# Patient Record
Sex: Male | Born: 1970 | Race: White | Hispanic: No | Smoking: Never smoker
Health system: Southern US, Community
[De-identification: ages and names within clinical notes are randomized; demographics above are authoritative.]

## PROBLEM LIST (undated history)

## (undated) DIAGNOSIS — K429 Umbilical hernia without obstruction or gangrene: Secondary | ICD-10-CM

## (undated) DIAGNOSIS — J189 Pneumonia, unspecified organism: Secondary | ICD-10-CM

## (undated) DIAGNOSIS — I10 Essential (primary) hypertension: Secondary | ICD-10-CM

## (undated) DIAGNOSIS — M1990 Unspecified osteoarthritis, unspecified site: Secondary | ICD-10-CM

## (undated) DIAGNOSIS — N529 Male erectile dysfunction, unspecified: Secondary | ICD-10-CM

## (undated) DIAGNOSIS — K4090 Unilateral inguinal hernia, without obstruction or gangrene, not specified as recurrent: Secondary | ICD-10-CM

## (undated) DIAGNOSIS — F319 Bipolar disorder, unspecified: Secondary | ICD-10-CM

## (undated) HISTORY — PX: SHOULDER SURGERY: SHX246

## (undated) HISTORY — DX: Essential (primary) hypertension: I10

## (undated) HISTORY — PX: ANKLE SURGERY: SHX546

## (undated) HISTORY — DX: Bipolar disorder, unspecified: F31.9

## (undated) HISTORY — PX: OTHER SURGICAL HISTORY: SHX169

## (undated) HISTORY — PX: GASTRIC BYPASS: SHX52

## (undated) HISTORY — PX: BACK SURGERY: SHX140

---
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
## 2004-05-24 HISTORY — PX: INGUINAL HERNIA REPAIR: SHX194

## 2018-06-26 DIAGNOSIS — F32A Depression, unspecified: Secondary | ICD-10-CM | POA: Insufficient documentation

## 2018-06-26 DIAGNOSIS — G4730 Sleep apnea, unspecified: Secondary | ICD-10-CM | POA: Insufficient documentation

## 2018-06-26 DIAGNOSIS — K589 Irritable bowel syndrome without diarrhea: Secondary | ICD-10-CM | POA: Insufficient documentation

## 2018-06-26 DIAGNOSIS — K219 Gastro-esophageal reflux disease without esophagitis: Secondary | ICD-10-CM | POA: Insufficient documentation

## 2018-06-26 DIAGNOSIS — M549 Dorsalgia, unspecified: Secondary | ICD-10-CM | POA: Insufficient documentation

## 2018-09-28 DIAGNOSIS — F3175 Bipolar disorder, in partial remission, most recent episode depressed: Secondary | ICD-10-CM | POA: Insufficient documentation

## 2019-01-30 DIAGNOSIS — K913 Postprocedural intestinal obstruction, unspecified as to partial versus complete: Secondary | ICD-10-CM | POA: Insufficient documentation

## 2020-12-10 ENCOUNTER — Other Ambulatory Visit: Payer: Self-pay

## 2020-12-10 ENCOUNTER — Ambulatory Visit
Admission: RE | Admit: 2020-12-10 | Discharge: 2020-12-10 | Disposition: A | Discharge status: Home / Self Care | Payer: BC Managed Care – PPO | Source: Ambulatory Visit

## 2020-12-10 VITALS — BP 146/92 | HR 76 | Temp 98.4°F | Resp 18

## 2020-12-10 DIAGNOSIS — M7989 Other specified soft tissue disorders: Secondary | ICD-10-CM | POA: Diagnosis not present

## 2020-12-10 MED ORDER — PREDNISONE 10 MG PO TABS: ORAL_TABLET | ORAL | 0 refills | Status: DC

## 2020-12-10 NOTE — ED Triage Notes (Signed)
Pt Is present today with right pointer injury. Pt states that he may have a cyst on the inner part on his pointer finger. Pt states that he noticed it two weeks ago.

## 2020-12-10 NOTE — ED Provider Notes (Signed)
[facility]    CSN: [id number] Arrival date & time: 12/10/20  1202      History   Chief Complaint Chief Complaint  Patient presents with   Cyst    Inner right pointer finger      HPI Aaron Lozano is a 50 y.o. male presenting today for evaluation of right index finger pain.  Reports over the past couple weeks he has developed an area of swelling to the lateral aspect of his index finger.  Has progressively worsened in pain.  Using diclofenac without relief.  Denies injury or trauma.  Denies history of similar.  Pain interfering with bending as well as day-to-day activities  HPI  History reviewed. No pertinent past medical history.  There are no problems to display for this patient.   Past Surgical History:  Procedure Laterality Date   ANKLE SURGERY     BACK SURGERY     GASTRIC BYPASS     SHOULDER SURGERY         Home Medications    Prior to Admission medications   Medication Sig Start Date End Date Taking? Authorizing Provider  predniSONE (DELTASONE) 10 MG tablet Begin with 6 tabs on day 1&2, 5 tab on day 3&4, 4 tab on day 5&6, 3 tab on day 7&8, 2 tab on day 9&10, 1 tab on day 11&12-take with food 12/10/20  Yes Adisynn Suleiman C, PA-C  sildenafil (REVATIO) 20 MG tablet SMARTSIG:5 Tablet(s) By Mouth Daily PRN 11/30/20   [provider]    Family History History reviewed. No pertinent family history.  Social History Social History   Tobacco Use   Smoking status: Never   Smokeless tobacco: Never  Vaping Use   Vaping Use: Never used  Substance Use Topics   Alcohol use: Yes   Drug use: Never     Allergies   Patient has no known allergies.   Review of Systems Review of Systems  Constitutional:  Negative for fatigue and fever.  Eyes:  Negative for redness, itching and visual disturbance.  Respiratory:  Negative for shortness of breath.   Cardiovascular:  Negative for chest pain and leg swelling.  Gastrointestinal:  Negative for nausea  and vomiting.  Musculoskeletal:  Positive for arthralgias. Negative for myalgias.  Skin:  Negative for color change, rash and wound.  Neurological:  Negative for dizziness, syncope, weakness, light-headedness and headaches.    Physical Exam Triage Vital Signs ED Triage Vitals  Enc Vitals Group     BP      Pulse      Resp      Temp      Temp src      SpO2      Weight      Height      Head Circumference      Peak Flow      Pain Score      Pain Loc      Pain Edu?      Excl. in [place]?    No data found.  Updated Vital Signs BP (!) 146/92   Pulse 76   Temp 98.4 F (36.9 C)   Resp 18   SpO2 95%   Visual Acuity Right Eye Distance:   Left Eye Distance:   Bilateral Distance:    Right Eye Near:   Left Eye Near:    Bilateral Near:     Physical Exam Vitals and nursing note reviewed.  Constitutional:      Appearance: He is  well-developed.     Comments: No acute distress  HENT:     Head: Normocephalic and atraumatic.     Nose: Nose normal.  Eyes:     Conjunctiva/sclera: Conjunctivae normal.  Cardiovascular:     Rate and Rhythm: Normal rate.  Pulmonary:     Effort: Pulmonary effort is normal. No respiratory distress.  Abdominal:     General: There is no distension.  Musculoskeletal:        General: Normal range of motion.     Cervical back: Neck supple.     Comments: Right index finger with firm palpable knot over lateral aspect of proximal phalanx without overlying erythema or warmth, full active range of motion at IP joints and MCP joint although does elicit pain, but area is sensitive to light touch  Skin:    General: Skin is warm and dry.  Neurological:     Mental Status: He is alert and oriented to person, place, and time.     UC Treatments / Results  Labs (all labs ordered are listed, but only abnormal results are displayed) Labs Reviewed - No data to display  EKG   Radiology No results found.  Procedures Procedures (including critical care  time)  Medications Ordered in UC Medications - No data to display  Initial Impression / Assessment and Plan / UC Course  I have reviewed the triage vital signs and the nursing notes.  Pertinent labs & imaging results that were available during my care of the patient were reviewed by me and considered in my medical decision making (see chart for details).     Patient with firm nodule versus cyst over lower aspect of index finger, recommending further follow-up with hand, trial of prednisone course, taper provided, warm compresses.  Discussed strict return precautions. Patient verbalized understanding and is agreeable with plan.  Final Clinical Impressions(s) / UC Diagnoses   Final diagnoses:  Swelling of right index finger     Discharge Instructions      Begin prednisone taper x12 days Warm compresses Follow-up with hand-contact below     ED Prescriptions     Medication Sig Dispense Auth. Provider   predniSONE (DELTASONE) 10 MG tablet Begin with 6 tabs on day 1&2, 5 tab on day 3&4, 4 tab on day 5&6, 3 tab on day 7&8, 2 tab on day 9&10, 1 tab on day 11&12-take with food 42 tablet Verna Hamon C, PA-C      PDMP not reviewed this encounter.   Janith Lima, PA-C 12/10/20 1250

## 2020-12-10 NOTE — Discharge Instructions (Addendum)
Begin prednisone taper x12 days Warm compresses Follow-up with hand-contact below

## 2021-02-03 ENCOUNTER — Emergency Department (HOSPITAL_BASED_OUTPATIENT_CLINIC_OR_DEPARTMENT_OTHER): Payer: BC Managed Care – PPO

## 2021-02-03 ENCOUNTER — Ambulatory Visit
Admission: EM | Admit: 2021-02-03 | Discharge: 2021-02-03 | Disposition: A | Discharge status: Home / Self Care | Payer: BC Managed Care – PPO

## 2021-02-03 ENCOUNTER — Emergency Department (HOSPITAL_BASED_OUTPATIENT_CLINIC_OR_DEPARTMENT_OTHER)
Admission: EM | Admit: 2021-02-03 | Discharge: 2021-02-03 | Disposition: A | Discharge status: Home / Self Care | Payer: BC Managed Care – PPO | Attending: Emergency Medicine | Admitting: Emergency Medicine

## 2021-02-03 ENCOUNTER — Other Ambulatory Visit: Payer: Self-pay

## 2021-02-03 ENCOUNTER — Encounter: Payer: Self-pay | Admitting: Emergency Medicine

## 2021-02-03 ENCOUNTER — Encounter (HOSPITAL_BASED_OUTPATIENT_CLINIC_OR_DEPARTMENT_OTHER): Payer: Self-pay

## 2021-02-03 DIAGNOSIS — K4090 Unilateral inguinal hernia, without obstruction or gangrene, not specified as recurrent: Secondary | ICD-10-CM

## 2021-02-03 DIAGNOSIS — R1031 Right lower quadrant pain: Secondary | ICD-10-CM | POA: Diagnosis not present

## 2021-02-03 LAB — CBC WITH DIFFERENTIAL/PLATELET
Abs Immature Granulocytes: 0.01 10*3/uL (ref 0.00–0.07)
Basophils Absolute: 0.1 10*3/uL (ref 0.0–0.1)
Basophils Relative: 1 %
Eosinophils Absolute: 0.1 10*3/uL (ref 0.0–0.5)
Eosinophils Relative: 2 %
HCT: 46.6 % (ref 39.0–52.0)
Hemoglobin: 15.7 g/dL (ref 13.0–17.0)
Immature Granulocytes: 0 %
Lymphocytes Relative: 26 %
Lymphs Abs: 1.4 10*3/uL (ref 0.7–4.0)
MCH: 30 pg (ref 26.0–34.0)
MCHC: 33.7 g/dL (ref 30.0–36.0)
MCV: 88.9 fL (ref 80.0–100.0)
Monocytes Absolute: 0.6 10*3/uL (ref 0.1–1.0)
Monocytes Relative: 11 %
Neutro Abs: 3.2 10*3/uL (ref 1.7–7.7)
Neutrophils Relative %: 60 %
Platelets: 228 10*3/uL (ref 150–400)
RBC: 5.24 MIL/uL (ref 4.22–5.81)
RDW: 13.1 % (ref 11.5–15.5)
WBC: 5.4 10*3/uL (ref 4.0–10.5)
nRBC: 0 % (ref 0.0–0.2)

## 2021-02-03 LAB — COMPREHENSIVE METABOLIC PANEL
ALT: 20 U/L (ref 0–44)
AST: 20 U/L (ref 15–41)
Albumin: 4.2 g/dL (ref 3.5–5.0)
Alkaline Phosphatase: 48 U/L (ref 38–126)
Anion gap: 7 (ref 5–15)
BUN: 14 mg/dL (ref 6–20)
CO2: 24 mmol/L (ref 22–32)
Calcium: 8.9 mg/dL (ref 8.9–10.3)
Chloride: 109 mmol/L (ref 98–111)
Creatinine, Ser: 0.92 mg/dL (ref 0.61–1.24)
GFR, Estimated: >60 mL/min (ref >60)
Glucose, Bld: 68 mg/dL — ABNORMAL LOW (ref 70–99)
Potassium: 4.1 mmol/L (ref 3.5–5.1)
Sodium: 140 mmol/L (ref 135–145)
Total Bilirubin: 0.9 mg/dL (ref 0.3–1.2)
Total Protein: 6.2 g/dL — ABNORMAL LOW (ref 6.5–8.1)

## 2021-02-03 LAB — URINALYSIS, ROUTINE W REFLEX MICROSCOPIC
Bilirubin Urine: NEGATIVE
Glucose, UA: NEGATIVE mg/dL
Hgb urine dipstick: NEGATIVE
Leukocytes,Ua: NEGATIVE
Nitrite: NEGATIVE
Specific Gravity, Urine: 1.031 — ABNORMAL HIGH (ref 1.005–1.030)
pH: 5.5 (ref 5.0–8.0)

## 2021-02-03 MED ORDER — IOHEXOL 350 MG/ML SOLN
80.0000 mL | Freq: Once | INTRAVENOUS | Status: AC | PRN
  Administered 2021-02-03: 80 mL via INTRAVENOUS

## 2021-02-03 MED ORDER — METHOCARBAMOL 500 MG PO TABS
1000.0000 mg | ORAL_TABLET | Freq: Once | ORAL | Status: AC
  Administered 2021-02-03: 1000 mg via ORAL
  Filled 2021-02-03: qty 2

## 2021-02-03 MED ORDER — HYDROMORPHONE HCL 1 MG/ML IJ SOLN
1.0000 mg | Freq: Once | INTRAMUSCULAR | Status: AC
  Administered 2021-02-03: 1 mg via INTRAVENOUS
  Filled 2021-02-03: qty 1

## 2021-02-03 MED ORDER — IOHEXOL 350 MG/ML SOLN: 80.0000 mL | Freq: Once | INTRAVENOUS | Status: DC | PRN

## 2021-02-03 MED ORDER — MORPHINE SULFATE (PF) 4 MG/ML IV SOLN
4.0000 mg | Freq: Once | INTRAVENOUS | Status: AC
Start: 2021-02-03 — End: 2021-02-03
  Administered 2021-02-03: 4 mg via INTRAVENOUS
  Filled 2021-02-03: qty 1

## 2021-02-03 MED ORDER — DICYCLOMINE HCL 10 MG PO CAPS
10.0000 mg | ORAL_CAPSULE | Freq: Once | ORAL | Status: AC
  Administered 2021-02-03: 10 mg via ORAL
  Filled 2021-02-03: qty 1

## 2021-02-03 MED ORDER — KETOROLAC TROMETHAMINE 15 MG/ML IJ SOLN
15.0000 mg | Freq: Once | INTRAMUSCULAR | Status: AC
  Administered 2021-02-03: 15 mg via INTRAVENOUS
  Filled 2021-02-03: qty 1

## 2021-02-03 MED ORDER — HYDROCODONE-ACETAMINOPHEN 5-325 MG PO TABS: 1.0000 | ORAL_TABLET | ORAL | 0 refills | Status: DC | PRN

## 2021-02-03 MED ORDER — SUCRALFATE 1 G PO TABS: 1.0000 g | ORAL_TABLET | Freq: Three times a day (TID) | ORAL | 0 refills | Status: DC

## 2021-02-03 MED ORDER — DICYCLOMINE HCL 20 MG PO TABS: 20.0000 mg | ORAL_TABLET | Freq: Two times a day (BID) | ORAL | 0 refills | Status: DC | PRN

## 2021-02-03 MED ORDER — ONDANSETRON HCL 4 MG/2ML IJ SOLN
4.0000 mg | Freq: Once | INTRAMUSCULAR | Status: AC
  Administered 2021-02-03: 4 mg via INTRAVENOUS
  Filled 2021-02-03: qty 2

## 2021-02-03 MED ORDER — SODIUM CHLORIDE 0.9 % IV BOLUS
1000.0000 mL | Freq: Once | INTRAVENOUS | Status: AC
  Administered 2021-02-03: 1000 mL via INTRAVENOUS

## 2021-02-03 NOTE — ED Triage Notes (Signed)
Pt is present today with RLQ. Pt states that he noticed the pain several days ago. Pt states that when he presses on that side he noticed a bulge. Pt describes the pain as being sharp.

## 2021-02-03 NOTE — ED Provider Notes (Signed)
[facility] DEPT Provider Note   CSN: [id number] Arrival date & time: 02/03/21  1353     History Chief Complaint  Patient presents with   Abdominal Pain    Aaron Lozano is a 50 y.o. male.  Patient is a 50 year old male with prior history of gastric bypass and left-sided hernia repair who is presenting today with worsening abdominal pain over the last 2 weeks.  Initially started 2 weeks ago as a dull pain in the right lower quadrant that over the last 3 to 4 days has become severe.  Any type of movement seems to make it worse.  Eating does not make it worse.  It will cause nausea at times.  However he denies any dysuria, frequency or urgency.  No diarrhea, vomiting, fever.  No cough or congestion.  He has no pain that radiates into his testicle.  He thinks he is seeing a bulge in that area but no prior history of hernia on the right.  The history is provided by the patient.  Abdominal Pain Pain location:  RLQ Pain quality: sharp, shooting and throbbing   Pain radiates to:  Does not radiate     History reviewed. No pertinent past medical history.  There are no problems to display for this patient.   Past Surgical History:  Procedure Laterality Date   ANKLE SURGERY     BACK SURGERY     GASTRIC BYPASS     INGUINAL HERNIA REPAIR Left 1988   INGUINAL HERNIA REPAIR  2006   SHOULDER SURGERY         History reviewed. No pertinent family history.  Social History   Tobacco Use   Smoking status: Never   Smokeless tobacco: Never  Vaping Use   Vaping Use: Every day  Substance Use Topics   Alcohol use: Yes   Drug use: Never    Home Medications Prior to Admission medications   Medication Sig Start Date End Date Taking? Authorizing Provider  predniSONE (DELTASONE) 10 MG tablet Begin with 6 tabs on day 1&2, 5 tab on day 3&4, 4 tab on day 5&6, 3 tab on day 7&8, 2 tab on day 9&10, 1 tab on day 11&12-take with food Patient not taking: Reported on  02/03/2021 12/10/20   Wieters, Madelynn Done C, PA-C  sildenafil (REVATIO) 20 MG tablet SMARTSIG:5 Tablet(s) By Mouth Daily PRN Patient not taking: Reported on 02/03/2021 11/30/20   [provider]    Allergies    Patient has no known allergies.  Review of Systems   Review of Systems  Gastrointestinal:  Positive for abdominal pain.  All other systems reviewed and are negative.  Physical Exam Updated Vital Signs BP (!) 139/91 (BP Location: Right Arm)   Pulse 79   Temp 98 F (36.7 C) (Oral)   Resp 16   Ht '5\' 9"'$  (1.753 m)   Wt 95.3 kg   SpO2 99%   BMI 31.01 kg/m   Physical Exam Vitals and nursing note reviewed.  Constitutional:      General: He is not in acute distress.    Appearance: Normal appearance. He is well-developed and normal weight.  HENT:     Head: Normocephalic and atraumatic.  Eyes:     Conjunctiva/sclera: Conjunctivae normal.     Pupils: Pupils are equal, round, and reactive to light.  Cardiovascular:     Rate and Rhythm: Normal rate and regular rhythm.     Heart sounds: No murmur heard. Pulmonary:     Effort:  Pulmonary effort is normal. No respiratory distress.     Breath sounds: Normal breath sounds. No wheezing or rales.  Abdominal:     General: There is no distension.     Palpations: Abdomen is soft.     Tenderness: There is abdominal tenderness in the right lower quadrant. There is guarding. There is no rebound.     Hernia: No hernia is present. There is no hernia in the right femoral area or right inguinal area.  Genitourinary:    Testes: Normal.  Musculoskeletal:        General: No tenderness. Normal range of motion.     Cervical back: Normal range of motion and neck supple.  Skin:    General: Skin is warm and dry.     Findings: No erythema or rash.  Neurological:     Mental Status: He is alert and oriented to person, place, and time. Mental status is at baseline.  Psychiatric:        Mood and Affect: Mood normal.        Behavior: Behavior  normal.    ED Results / Procedures / Treatments   Labs (all labs ordered are listed, but only abnormal results are displayed) Labs Reviewed  CBC WITH DIFFERENTIAL/PLATELET  COMPREHENSIVE METABOLIC PANEL  URINALYSIS, ROUTINE W REFLEX MICROSCOPIC    EKG None  Radiology No results found.  Procedures Procedures   Medications Ordered in ED Medications  HYDROmorphone (DILAUDID) injection 1 mg (has no administration in time range)  ondansetron (ZOFRAN) injection 4 mg (has no administration in time range)    ED Course  I have reviewed the triage vital signs and the nursing notes.  Pertinent labs & imaging results that were available during my care of the patient were reviewed by me and considered in my medical decision making (see chart for details).    MDM Rules/Calculators/A&P                           50 year old male presenting today for worsening right-sided abdominal pain.  He has significant pain with guarding in the right lower quadrant.  Symptoms have been present for approximately 2 weeks but are worsening in severity.  Concern for possible diverticulitis, lower suspicion for appendicitis just given the length of time his symptoms have been present.  Denies any urinary symptoms.  No radiation of pain into the testicle or the back.  Low suspicion suspicion for torsion, UTI or pyelonephritis.  Patient given pain control.  Labs and imaging are pending.  Final Clinical Impression(s) / ED Diagnoses Final diagnoses:  None    Rx / DC Orders ED Discharge Orders     None        Blanchie Dessert, MD 02/07/21 [id number]

## 2021-02-03 NOTE — ED Triage Notes (Signed)
Pt arrives POV, with c/o of RLQ abdominal pain for approximately 2 weeks, but has gotten worse over the last day.  Has had some nausea but denies vomiting or diarrhea.  Reports a bulge in RLQ.

## 2021-02-03 NOTE — ED Provider Notes (Signed)
[facility]    CSN: [id number] Arrival date & time: 02/03/21  1037      History   Chief Complaint Chief Complaint  Patient presents with   Abdominal Pain    HPI Aaron Lozano is a 50 y.o. male.  Patient reports right lower quadrant/inguinal pain intermittently for the last 2 weeks and he feels a bulge on his abdomen associated with the pain.  Increasing in pain severity and frequency.  Patient feels a bulge in his right lower quadrant near where his trunk meets his leg that can be extremely painful.  Pt has had inguinal hernias in the past but they were not painful, found on exam only. This morning pain was so severe it caused him to vomit and while the pain is less severe now, he still has pain.     Abdominal Pain Associated symptoms: no chills and no fever    History reviewed. No pertinent past medical history.  There are no problems to display for this patient.   Past Surgical History:  Procedure Laterality Date   ANKLE SURGERY     BACK SURGERY     GASTRIC BYPASS     SHOULDER SURGERY         Home Medications    Prior to Admission medications   Medication Sig Start Date End Date Taking? Authorizing Provider  predniSONE (DELTASONE) 10 MG tablet Begin with 6 tabs on day 1&2, 5 tab on day 3&4, 4 tab on day 5&6, 3 tab on day 7&8, 2 tab on day 9&10, 1 tab on day 11&12-take with food 12/10/20   Wieters, Hallie C, PA-C  sildenafil (REVATIO) 20 MG tablet SMARTSIG:5 Tablet(s) By Mouth Daily PRN 11/30/20   [provider]    Family History History reviewed. No pertinent family history.  Social History Social History   Tobacco Use   Smoking status: Never   Smokeless tobacco: Never  Vaping Use   Vaping Use: Never used  Substance Use Topics   Alcohol use: Yes   Drug use: Never     Allergies   Patient has no known allergies.   Review of Systems Review of Systems  Constitutional:  Negative for chills and fever.  Gastrointestinal:  Positive for  abdominal pain.    Physical Exam Triage Vital Signs ED Triage Vitals  Enc Vitals Group     BP 02/03/21 1050 (!) 158/84     Pulse Rate 02/03/21 1050 76     Resp 02/03/21 1050 18     Temp 02/03/21 1050 98.6 F (37 C)     Temp src --      SpO2 02/03/21 1050 95 %     Weight --      Height --      Head Circumference --      Peak Flow --      Pain Score 02/03/21 1053 4     Pain Loc --      Pain Edu? --      Excl. in [place]? --    No data found.  Updated Vital Signs BP (!) 158/84   Pulse 76   Temp 98.6 F (37 C)   Resp 18   SpO2 95%   Visual Acuity Right Eye Distance:   Left Eye Distance:   Bilateral Distance:    Right Eye Near:   Left Eye Near:    Bilateral Near:     Physical Exam Abdominal:     General: Abdomen is flat. Bowel sounds  are normal.     Tenderness: There is abdominal tenderness in the right lower quadrant.     Comments: There is a lump in RLQ that is exquisitely tender to gentle touch.      UC Treatments / Results  Labs (all labs ordered are listed, but only abnormal results are displayed) Labs Reviewed - No data to display  E Initial Impression / Assessment and Plan / UC Course  I have reviewed the triage vital signs and the nursing notes.  Pertinent labs & imaging results that were available during my care of the patient were reviewed by me and considered in my medical decision making (see chart for details).   I am suspicious of hernia and concerned about pt's pain severity, lump in RLQ. Instructed pt to seek care in ED for further eval.    Final Clinical Impressions(s) / UC Diagnoses   Final diagnoses:  Right lower quadrant abdominal pain     Discharge Instructions      Please go to Emergency Department for further evaluation of this problem. Try [facility] in [place]   ED Prescriptions   None    PDMP not reviewed this encounter.   Carvel Getting, NP 02/03/21 1218

## 2021-02-03 NOTE — Discharge Instructions (Addendum)
The etiology of your abdominal pain is unclear.    ]There are many causes of abdominal pain. Most pain is not serious and goes away, but some pain gets worse, changes, or will not go away. Please return to the emergency department or see your doctor right away if you (or your family member) experience any of the following:  1. Pain that gets worse or moves to just one spot.  2. Pain that gets worse if you cough or sneeze.  3. Pain with going over a bump in the road.  4. Pain that does not get better in 24 hours.  5. Inability to keep down liquids (vomiting)-especially if you are making less urine.  6. Fainting.  7. Blood in the vomit or stool.  8. High fever or shaking chills.  9. Swelling of the abdomen.  10. Any new or worsening problem.      Follow-up Instructions  Return to the emergency department in 8-12 hours for recheck if worse.  See your primary care provider if not completely better in the next 2-3 days. Come to the ED if you are unable to see them in this time frame.    Additional Instructions  No alcohol.  No caffeine, aspirin, or cigarettes.   Please return to the emergency department immediately for any new or concerning symptoms, or if you get worse.

## 2021-02-03 NOTE — ED Provider Notes (Signed)
Pt received at handoff, see prior EDP for complete note  50 yo male with hx gastric bypass, left hernia repair to ED for abdominal pain over lats 2 weeks, worse in last 24 hrs. Possible bulge to RLQ reported by pt. Transient nausea without emesis. No change to PO. No fevers or chills.  Physical Exam  BP (!) 134/97   Pulse (!) 51   Temp 98 F (36.7 C) (Oral)   Resp 15   Ht '5\' 9"'$  (1.753 m)   Wt 95.3 kg   SpO2 97%   BMI 31.01 kg/m   Physical Exam Vitals and nursing note reviewed.  Constitutional:      General: He is not in acute distress.    Appearance: He is well-developed.  HENT:     Head: Normocephalic and atraumatic.     Right Ear: External ear normal.     Left Ear: External ear normal.     Mouth/Throat:     Mouth: Mucous membranes are moist.  Eyes:     General: No scleral icterus. Cardiovascular:     Rate and Rhythm: Normal rate and regular rhythm.     Pulses: Normal pulses.     Heart sounds: Normal heart sounds.  Pulmonary:     Effort: Pulmonary effort is normal. No respiratory distress.     Breath sounds: Normal breath sounds.  Abdominal:     General: Abdomen is flat.     Palpations: Abdomen is soft.     Tenderness: There is abdominal tenderness in the right lower quadrant. There is no rebound. Negative signs include Murphy's sign.  Musculoskeletal:        General: Normal range of motion.     Cervical back: Normal range of motion.     Right lower leg: No edema.     Left lower leg: No edema.  Skin:    General: Skin is warm and dry.     Capillary Refill: Capillary refill takes less than 2 seconds.  Neurological:     Mental Status: He is alert and oriented to person, place, and time.  Psychiatric:        Mood and Affect: Mood normal.        Behavior: Behavior normal.    ED Course/Procedures     Procedures  MDM   50 yo male received at handoff, to ED with abdominal pain.   Physical exam is re-assuring. Abdomen is soft, non-peritoneal.  Labs reviewed and  are stable.  CT imaging of the abdomen is reviewed and does not demonstrate acute abnormalities.  Is have a small fat-containing inguinal hernia on the right.  No evidence of strangulation, incarceration.  Glucose slightly decreased on CMP, patient is tolerant of oral intake, ambulatory.  Pain has resolved. He is tolerating oral intake. No nausea or vomiting. No urinary complaints. No pain to penis or testicle. No rectal pain.   The patient's overall condition has improved, the patient presents with abdominal pain without signs of peritonitis, or other life-threatening serious etiology. The patient understands that at this time there is no evidence for a more malignant underlying process, but the patient also understands that early in the process of an illness, an emergency department workup can be falsely reassuring. Detailed discussions were had with the patient regarding current findings, and need for close f/u with PCP or on call doctor. The patient appears stable for discharge and has been instructed to return immediately if the symptoms worsen in any way, or in 8-12hr if not  improved for re-evaluation. Patient verbalized understanding and is in agreement with current care plan.  All questions answered prior to discharge.          Jeanell Sparrow, DO 02/03/21 Evalee Jefferson

## 2021-02-03 NOTE — Discharge Instructions (Signed)
Please go to Emergency Department for further evaluation of this problem. Try [facility] at [facility] in [place]

## 2021-02-03 NOTE — ED Notes (Signed)
Patient is being discharged from the Urgent Care and sent to the Emergency Department via POV . Per Carvel Getting, NP, patient is in need of higher level of care due to possible inguinal hernia. Patient is aware and verbalizes understanding of plan of care.  Vitals:   02/03/21 1050  BP: (!) 158/84  Pulse: 76  Resp: 18  Temp: 98.6 F (37 C)  SpO2: 95%

## 2021-02-10 ENCOUNTER — Ambulatory Visit
Admission: EM | Admit: 2021-02-10 | Discharge: 2021-02-10 | Disposition: A | Discharge status: Home / Self Care | Payer: BC Managed Care – PPO

## 2021-02-10 ENCOUNTER — Other Ambulatory Visit: Payer: Self-pay

## 2021-02-10 DIAGNOSIS — B029 Zoster without complications: Secondary | ICD-10-CM

## 2021-02-10 MED ORDER — VALACYCLOVIR HCL 1 G PO TABS: 1000.0000 mg | ORAL_TABLET | Freq: Three times a day (TID) | ORAL | 0 refills | Status: AC

## 2021-02-10 MED ORDER — GABAPENTIN 100 MG PO CAPS: 100.0000 mg | ORAL_CAPSULE | Freq: Three times a day (TID) | ORAL | 0 refills | Status: DC

## 2021-02-10 MED ORDER — HYDROCODONE-ACETAMINOPHEN 5-325 MG PO TABS: 1.0000 | ORAL_TABLET | Freq: Four times a day (QID) | ORAL | 0 refills | Status: DC | PRN

## 2021-02-10 NOTE — ED Triage Notes (Signed)
Pt reports possibly having shingles (describes pain as- tender and sore), pt reports breaking out on lower abd that started about 10 days ago. Pt states he has not had shingles before.   Pt states he has been taking gabapentin from another prior visit to a health care facility- medication decreased pain level.

## 2021-02-10 NOTE — ED Provider Notes (Signed)
[facility]    CSN: [id number] Arrival date & time: 02/10/21  3500      History   Chief Complaint Chief Complaint  Patient presents with   Rash    HPI Stanley Lyness is a 50 y.o. male presenting today for evaluation possible shingles.  Reports approximately 1 week ago he began to develop a sharp pain in his right lower abdomen/groin area, was seen initially for concern for hernia and had CT showing inguinal hernia, but did not feel this was attributing to severe pain he was experiencing.  A few days later he developed a rash to the area which is only been right-sided.  Denies rash to testicles, penis or on left side. Using gabapentin with some relief.   HPI  History reviewed. No pertinent past medical history.  There are no problems to display for this patient.   Past Surgical History:  Procedure Laterality Date   ANKLE SURGERY     BACK SURGERY     GASTRIC BYPASS     INGUINAL HERNIA REPAIR Left 1988   INGUINAL HERNIA REPAIR  2006   SHOULDER SURGERY         Home Medications    Prior to Admission medications   Medication Sig Start Date End Date Taking? Authorizing Provider  gabapentin (NEURONTIN) 100 MG capsule Take 1 capsule (100 mg total) by mouth 3 (three) times daily. 02/10/21  Yes Antolin Belsito C, PA-C  HYDROcodone-acetaminophen (NORCO/VICODIN) 5-325 MG tablet Take 1-2 tablets by mouth every 6 (six) hours as needed for severe pain. 02/10/21  Yes Krystiana Fornes C, PA-C  valACYclovir (VALTREX) 1000 MG tablet Take 1 tablet (1,000 mg total) by mouth 3 (three) times daily for 10 days. 02/10/21 02/20/21 Yes Charlena Haub C, PA-C  dicyclomine (BENTYL) 20 MG tablet Take 1 tablet (20 mg total) by mouth 2 (two) times daily as needed for spasms. 02/03/21   Jeanell Sparrow, DO  sildenafil (REVATIO) 20 MG tablet SMARTSIG:5 Tablet(s) By Mouth Daily PRN Patient not taking: Reported on 02/03/2021 11/30/20   [provider]  sucralfate (CARAFATE) 1 g tablet Take 1  tablet (1 g total) by mouth 4 (four) times daily -  with meals and at bedtime for 7 days. 02/03/21 02/10/21  Jeanell Sparrow, DO    Family History History reviewed. No pertinent family history.  Social History Social History   Tobacco Use   Smoking status: Never   Smokeless tobacco: Never  Vaping Use   Vaping Use: Every day  Substance Use Topics   Alcohol use: Yes   Drug use: Never     Allergies   Patient has no known allergies.   Review of Systems Review of Systems  Constitutional:  Negative for fatigue and fever.  Eyes:  Negative for redness, itching and visual disturbance.  Respiratory:  Negative for shortness of breath.   Cardiovascular:  Negative for chest pain and leg swelling.  Gastrointestinal:  Positive for abdominal pain. Negative for nausea and vomiting.  Musculoskeletal:  Negative for arthralgias and myalgias.  Skin:  Positive for color change and rash. Negative for wound.  Neurological:  Negative for dizziness, syncope, weakness, light-headedness and headaches.    Physical Exam Triage Vital Signs ED Triage Vitals  Enc Vitals Group     BP 02/10/21 0838 139/88     Pulse Rate 02/10/21 0838 77     Resp 02/10/21 0838 18     Temp 02/10/21 0838 99 F (37.2 C)     Temp  Source 02/10/21 0838 Oral     SpO2 02/10/21 0838 98 %     Weight --      Height --      Head Circumference --      Peak Flow --      Pain Score 02/10/21 0837 8     Pain Loc --      Pain Edu? --      Excl. in [place]? --    No data found.  Updated Vital Signs BP 139/88 (BP Location: Right Arm)   Pulse 77   Temp 99 F (37.2 C) (Oral)   Resp 18   SpO2 98%   Visual Acuity Right Eye Distance:   Left Eye Distance:   Bilateral Distance:    Right Eye Near:   Left Eye Near:    Bilateral Near:     Physical Exam Vitals and nursing note reviewed.  Constitutional:      Appearance: He is well-developed.     Comments: No acute distress  HENT:     Head: Normocephalic and atraumatic.      Nose: Nose normal.  Eyes:     Conjunctiva/sclera: Conjunctivae normal.  Cardiovascular:     Rate and Rhythm: Normal rate.  Pulmonary:     Effort: Pulmonary effort is normal. No respiratory distress.  Abdominal:     General: There is no distension.  Musculoskeletal:        General: Normal range of motion.     Cervical back: Neck supple.  Skin:    General: Skin is warm and dry.     Comments: Right lower abdomen/pubic area with erythematous/white pustule on erythematous base, does not cross midline  Neurological:     Mental Status: He is alert and oriented to person, place, and time.     UC Treatments / Results  Labs (all labs ordered are listed, but only abnormal results are displayed) Labs Reviewed - No data to display  EKG   Radiology No results found.  Procedures Procedures (including critical care time)  Medications Ordered in UC Medications - No data to display  Initial Impression / Assessment and Plan / UC Course  I have reviewed the triage vital signs and the nursing notes.  Pertinent labs & imaging results that were available during my care of the patient were reviewed by me and considered in my medical decision making (see chart for details).     Rash consistent with shingles-initiate on Valtrex although outside of the 72-hour window, refilling gabapentin as this has been helpful for him, hydrocodone only for severe pain.  Discussed strict return precautions. Patient verbalized understanding and is agreeable with plan.  Final Clinical Impressions(s) / UC Diagnoses   Final diagnoses:  Herpes zoster without complication     Discharge Instructions      Begin Valtrex 3 times daily for the next 10 days Gabapentin 3 times daily to help with nerve pain Hydrocodone only for severe pain Please follow-up if rash not resolving/pain not resolving      ED Prescriptions     Medication Sig Dispense Auth. Provider   valACYclovir (VALTREX) 1000 MG tablet  Take 1 tablet (1,000 mg total) by mouth 3 (three) times daily for 10 days. 30 tablet Glenis Musolf C, PA-C   gabapentin (NEURONTIN) 100 MG capsule Take 1 capsule (100 mg total) by mouth 3 (three) times daily. 30 capsule Elton Heid C, PA-C   HYDROcodone-acetaminophen (NORCO/VICODIN) 5-325 MG tablet Take 1-2 tablets by mouth every 6 (six)  hours as needed for severe pain. 12 tablet Shiya Fogelman, Scott C, PA-C      I have reviewed the PDMP during this encounter.   Janith Lima, [place] 02/10/21 [phone]

## 2021-02-10 NOTE — Discharge Instructions (Signed)
Begin Valtrex 3 times daily for the next 10 days Gabapentin 3 times daily to help with nerve pain Hydrocodone only for severe pain Please follow-up if rash not resolving/pain not resolving

## 2021-07-29 ENCOUNTER — Other Ambulatory Visit: Payer: Self-pay | Admitting: Orthopedic Surgery

## 2021-07-29 ENCOUNTER — Other Ambulatory Visit: Payer: Self-pay

## 2021-07-29 ENCOUNTER — Ambulatory Visit: Payer: BC Managed Care – PPO | Admitting: Plastic Surgery

## 2021-07-29 ENCOUNTER — Encounter: Payer: Self-pay | Admitting: Plastic Surgery

## 2021-07-29 VITALS — BP 148/87 | HR 79 | Ht 69.0 in | Wt 215.0 lb

## 2021-07-29 DIAGNOSIS — Z411 Encounter for cosmetic surgery: Secondary | ICD-10-CM

## 2021-07-29 DIAGNOSIS — M793 Panniculitis, unspecified: Secondary | ICD-10-CM | POA: Diagnosis not present

## 2021-07-29 NOTE — Progress Notes (Signed)
? ?Referring Provider ?No referring provider defined for this encounter.  ? ?CC:  ?Chief Complaint  ?Patient presents with  ? Consult  ?   ? ?Aaron Lozano is an 51 y.o. male.  ?HPI: Patient presents to discuss abdominal contouring after weight loss.  He had a gastric bypass surgery done years ago.  He lost 132 pounds.  He has been at a stable weight for greater than 6 months.  He gets rashes in the infraumbilical skin has been refractory to over-the-counter treatments.  He does not smoke and is not a diabetic.  He has had no other abdominal surgeries.  He exercises regularly.  He also reports issues regarding a right inguinal hernia that has been present for some time.  It is increasing in pain.  He reports being able to reduce the contents but would like to work on getting this repaired as well. ? ?No Known Allergies ? ?Outpatient Encounter Medications as of 07/29/2021  ?Medication Sig  ? sildenafil (REVATIO) 20 MG tablet   ? [DISCONTINUED] dicyclomine (BENTYL) 20 MG tablet Take 1 tablet (20 mg total) by mouth 2 (two) times daily as needed for spasms.  ? [DISCONTINUED] gabapentin (NEURONTIN) 100 MG capsule Take 1 capsule (100 mg total) by mouth 3 (three) times daily.  ? [DISCONTINUED] HYDROcodone-acetaminophen (NORCO/VICODIN) 5-325 MG tablet Take 1-2 tablets by mouth every 6 (six) hours as needed for severe pain.  ? [DISCONTINUED] sucralfate (CARAFATE) 1 g tablet Take 1 tablet (1 g total) by mouth 4 (four) times daily -  with meals and at bedtime for 7 days.  ? ?No facility-administered encounter medications on file as of 07/29/2021.  ?  ? ?No past medical history on file. ? ?Past Surgical History:  ?Procedure Laterality Date  ? ANKLE SURGERY    ? BACK SURGERY    ? GASTRIC BYPASS    ? INGUINAL HERNIA REPAIR Left 1988  ? INGUINAL HERNIA REPAIR  2006  ? SHOULDER SURGERY    ? ? ?No family history on file. ? ?Social History  ? ?Social History Narrative  ? Not on file  ?  ? ?Review of Systems ?General: Denies fevers,  chills, weight loss ?CV: Denies chest pain, shortness of breath, palpitations ? ?Physical Exam ?Vitals with BMI 07/29/2021 02/10/2021 02/03/2021  ?Height '5\' 9"'$  - -  ?Weight 215 lbs - -  ?BMI 31.74 - -  ?Systolic 025 427 062  ?Diastolic 87 88 88  ?Pulse 79 77 52  ?  ?General:  No acute distress,  Alert and oriented, Non-Toxic, Normal speech and affect ?Abdomen: Abdomen is soft nontender.  He does seem to have a reducible right inguinal hernia.  Well-healed scars.  Significant excess skin in the infraumbilical area and skin laxity in the supraumbilical area.  The excess does extend down into the suprapubic area where a lot of his symptoms and rashes occur.  Minimal excess adipose tissue.  Good muscle tone.  ? ?Assessment/Plan ?Patient is a good candidate for abdominal contouring after gastric bypass.  I will plan to have him evaluated by our general surgery colleagues for the right inguinal hernia.  In the meantime we will work on approval for the removal of excess skin in the abdomen.  I do think he is a good candidate for this and would benefit from infra and supraumbilical skin removal.  We discussed risks of the procedure that include bleeding, infection, damage to surrounding structures need for additional procedures.  We discussed the need for drains postoperatively.  I  explained the location and orientation of the scars.  I could combine this with liposuction of the lower flank area which would improve the contour there to some degree and would also be improved by the skin removal.  Patient is fully understanding and interested in moving forward. ? ?Cindra Presume ?07/29/2021, 5:59 PM  ? ? ?  ?

## 2021-07-30 ENCOUNTER — Encounter (HOSPITAL_BASED_OUTPATIENT_CLINIC_OR_DEPARTMENT_OTHER): Payer: Self-pay | Admitting: Orthopedic Surgery

## 2021-07-30 NOTE — H&P (Signed)
History: ?CC / Reason for Visit: Right index finger problem ?HPI: This patient returns reevaluation, indicating that the problem affecting his right index finger has again become symptomatic, even worse than it was before.  Topical lidocaine is not really that helpful.  He had a repeat cortisone injection in November that was not nearly as helpful either.  He is taking Tylenol and ibuprofen, but NSAIDs bother his stomach to some degree as he previously had a gastric bypass.  He did ultimately undergo MRI evaluation 12-to-22, and the official findings indicate that on the postcontrast axial images there may be a small 5 mm enhancing nodule, which could represent a hemangioma or neurofibroma.  The patient reports that even touching the skin very lightly elicits very dysesthetic pain ? 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  He is developed a lump on the ulnar aspect just distal to the webspace.  It is hypersensitive to light pressure upon it, even with sensitivity of the overlying skin.  He has started gabapentin and topical lidocaine neither 1 of which seem to help much, after being evaluated by Dr. Percell Miller on 12-17-20.  He thinks the gabapentin was just the 100 mg twice a day.  MRI scan was previously scheduled but unable to be performed due to a scheduling conflict.  He reports that there was no specific trauma and no mechanism similar to what creates bowlers thumb for his index finger.  He thinks that a similar condition started on the left long finger radial side, but is not as bad and has largely improved ? ?Review of systems as related to current complaint reviewed and unchanged. ? ?Exam:  ?Vitals: Refer to EMR. ?Constitutional:  WD, WN, NAD ?HEENT:  NCAT, EOMI ?Neuro/Psych:  Alert & oriented to person, place, and time; appropriate mood & affect ?Lymphatic: No generalized UE edema or  lymphadenopathy ?Extremities / MSK:  Both UE are normal with respect to appearance, ranges of motion, joint stability, muscle strength/tone, sensation, & perfusion except as otherwise noted: ? ?There is a small BB-shaped lump on the ulnar side of the right index finger in the midportion of P1.  It is exquisitely sensitive to even light palpation and touch of it or around it.  I did not try eliciting a Tinel sign.  It is somewhat oblong a fusiform, but now there is also altered sensibility on the ulnar portion of the palmar aspect of the digit distal to this. ? ?Labs / Xrays:  ?No radiographic studies obtained today. ? 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 ?Plan:  ?I discussed these findings with him.  He thinks that time is now appropriate to consider excision.  We reviewed potential risks, including neuroma formation, neuroma pain, and even permanent numbness along that digit.  He is prepared for this and finds that his dysesthetic pain is bad enough for him to consider that.  We will look for a time after the first of the year and Katharine Look will coordinate this with him.  We will have nerve allograft available for reconstruction if needed.  The details of the operative procedure were discussed with the patient.  Questions were invited and answered.  In addition to the goal of the procedure, the risks of the procedure to include but not limited  to bleeding; infection; damage to the nerves or blood vessels that could result in bleeding, numbness, weakness, chronic pain, and the need for additional procedures; stiffness; the need for revision surgery; and anesthetic risks were reviewed.  No specific outcome was guaranteed or implied.  Informed consent was obtained. ?

## 2021-08-10 ENCOUNTER — Ambulatory Visit (HOSPITAL_BASED_OUTPATIENT_CLINIC_OR_DEPARTMENT_OTHER)
Admission: RE | Admit: 2021-08-10 | Discharge: 2021-08-10 | Disposition: A | Discharge status: Home / Self Care | Payer: BC Managed Care – PPO | Attending: Orthopedic Surgery | Admitting: Orthopedic Surgery

## 2021-08-10 ENCOUNTER — Encounter (HOSPITAL_BASED_OUTPATIENT_CLINIC_OR_DEPARTMENT_OTHER)
Admission: RE | Disposition: A | Discharge status: Home / Self Care | Payer: Self-pay | Source: Home / Self Care | Attending: Orthopedic Surgery

## 2021-08-10 ENCOUNTER — Encounter (HOSPITAL_BASED_OUTPATIENT_CLINIC_OR_DEPARTMENT_OTHER): Payer: Self-pay | Admitting: Orthopedic Surgery

## 2021-08-10 ENCOUNTER — Ambulatory Visit (HOSPITAL_BASED_OUTPATIENT_CLINIC_OR_DEPARTMENT_OTHER): Payer: BC Managed Care – PPO | Admitting: Certified Registered"

## 2021-08-10 ENCOUNTER — Other Ambulatory Visit: Payer: Self-pay

## 2021-08-10 DIAGNOSIS — D3610 Benign neoplasm of peripheral nerves and autonomic nervous system, unspecified: Secondary | ICD-10-CM | POA: Diagnosis not present

## 2021-08-10 DIAGNOSIS — Z9884 Bariatric surgery status: Secondary | ICD-10-CM | POA: Diagnosis not present

## 2021-08-10 DIAGNOSIS — R2231 Localized swelling, mass and lump, right upper limb: Secondary | ICD-10-CM | POA: Diagnosis present

## 2021-08-10 HISTORY — PX: EXCISION METACARPAL MASS: SHX6372

## 2021-08-10 SURGERY — EXCISION METACARPAL MASS
Anesthesia: Monitor Anesthesia Care | Site: Hand | Laterality: Right

## 2021-08-10 MED ORDER — MIDAZOLAM HCL 2 MG/2ML IJ SOLN
INTRAMUSCULAR | Status: AC
  Filled 2021-08-10: qty 2

## 2021-08-10 MED ORDER — DEXMEDETOMIDINE (PRECEDEX) IN NS 20 MCG/5ML (4 MCG/ML) IV SYRINGE
PREFILLED_SYRINGE | INTRAVENOUS | Status: DC | PRN
  Administered 2021-08-10: 16 ug via INTRAVENOUS

## 2021-08-10 MED ORDER — MIDAZOLAM HCL 2 MG/2ML IJ SOLN
2.0000 mg | Freq: Once | INTRAMUSCULAR | Status: AC
  Administered 2021-08-10: 2 mg via INTRAVENOUS

## 2021-08-10 MED ORDER — CEFAZOLIN SODIUM-DEXTROSE 2-4 GM/100ML-% IV SOLN
2.0000 g | INTRAVENOUS | Status: AC
  Administered 2021-08-10: 2 g via INTRAVENOUS

## 2021-08-10 MED ORDER — FENTANYL CITRATE (PF) 100 MCG/2ML IJ SOLN
INTRAMUSCULAR | Status: AC
  Filled 2021-08-10: qty 2

## 2021-08-10 MED ORDER — ROPIVACAINE HCL 5 MG/ML IJ SOLN
INTRAMUSCULAR | Status: DC | PRN
  Administered 2021-08-10: 30 mL via PERINEURAL

## 2021-08-10 MED ORDER — LACTATED RINGERS IV SOLN: INTRAVENOUS | Status: DC

## 2021-08-10 MED ORDER — FENTANYL CITRATE (PF) 100 MCG/2ML IJ SOLN
25.0000 ug | INTRAMUSCULAR | Status: DC | PRN
  Administered 2021-08-10 (×3): 50 ug via INTRAVENOUS

## 2021-08-10 MED ORDER — LIDOCAINE 2% (20 MG/ML) 5 ML SYRINGE
INTRAMUSCULAR | Status: DC | PRN
  Administered 2021-08-10: 30 mg via INTRAVENOUS

## 2021-08-10 MED ORDER — ACETAMINOPHEN 500 MG PO TABS
ORAL_TABLET | ORAL | Status: AC
Start: 2021-08-10 — End: ?
  Filled 2021-08-10: qty 2

## 2021-08-10 MED ORDER — ONDANSETRON HCL 4 MG/2ML IJ SOLN
INTRAMUSCULAR | Status: DC | PRN
Start: 2021-08-10 — End: 2021-08-10
  Administered 2021-08-10: 4 mg via INTRAVENOUS

## 2021-08-10 MED ORDER — IBUPROFEN 200 MG PO TABS: 600.0000 mg | ORAL_TABLET | Freq: Four times a day (QID) | ORAL | Status: DC

## 2021-08-10 MED ORDER — AMISULPRIDE (ANTIEMETIC) 5 MG/2ML IV SOLN: 10.0000 mg | Freq: Once | INTRAVENOUS | Status: DC | PRN

## 2021-08-10 MED ORDER — OXYCODONE HCL 5 MG PO TABS
ORAL_TABLET | ORAL | Status: AC
  Filled 2021-08-10: qty 2

## 2021-08-10 MED ORDER — OXYCODONE HCL 5 MG PO TABS
10.0000 mg | ORAL_TABLET | Freq: Once | ORAL | Status: AC
Start: 2021-08-10 — End: 2021-08-10
  Administered 2021-08-10: 10 mg via ORAL

## 2021-08-10 MED ORDER — CEFAZOLIN SODIUM-DEXTROSE 2-4 GM/100ML-% IV SOLN
INTRAVENOUS | Status: AC
  Filled 2021-08-10: qty 100

## 2021-08-10 MED ORDER — OXYCODONE HCL 5 MG/5ML PO SOLN: 5.0000 mg | Freq: Once | ORAL | Status: DC | PRN

## 2021-08-10 MED ORDER — OXYCODONE HCL 5 MG PO TABS: 5.0000 mg | ORAL_TABLET | Freq: Four times a day (QID) | ORAL | 0 refills | Status: DC | PRN

## 2021-08-10 MED ORDER — FENTANYL CITRATE (PF) 100 MCG/2ML IJ SOLN
100.0000 ug | Freq: Once | INTRAMUSCULAR | Status: AC
  Administered 2021-08-10: 100 ug via INTRAVENOUS

## 2021-08-10 MED ORDER — OXYCODONE HCL 5 MG PO TABS: 5.0000 mg | ORAL_TABLET | Freq: Once | ORAL | Status: DC | PRN

## 2021-08-10 MED ORDER — PROPOFOL 500 MG/50ML IV EMUL
INTRAVENOUS | Status: DC | PRN
  Administered 2021-08-10: 75 ug/kg/min via INTRAVENOUS

## 2021-08-10 MED ORDER — ONDANSETRON HCL 4 MG/2ML IJ SOLN: 4.0000 mg | Freq: Once | INTRAMUSCULAR | Status: DC | PRN

## 2021-08-10 MED ORDER — 0.9 % SODIUM CHLORIDE (POUR BTL) OPTIME
TOPICAL | Status: DC | PRN
  Administered 2021-08-10: 100 mL

## 2021-08-10 MED ORDER — ACETAMINOPHEN 500 MG PO TABS
1000.0000 mg | ORAL_TABLET | Freq: Once | ORAL | Status: AC
  Administered 2021-08-10: 1000 mg via ORAL

## 2021-08-10 MED ORDER — ACETAMINOPHEN 325 MG PO TABS: 650.0000 mg | ORAL_TABLET | Freq: Four times a day (QID) | ORAL | Status: DC

## 2021-08-10 SURGICAL SUPPLY — 74 items
APL PRP STRL LF DISP 70% ISPRP (MISCELLANEOUS) ×2
BAND INSRT 18 STRL LF DISP RB (MISCELLANEOUS)
BAND RUBBER #18 3X1/16 STRL (MISCELLANEOUS) IMPLANT
BLADE MINI RND TIP GREEN BEAV (BLADE) IMPLANT
BLADE SURG 15 STRL LF DISP TIS (BLADE) ×2 IMPLANT
BLADE SURG 15 STRL SS (BLADE) ×3
BNDG CMPR 5X2 CHSV 1 LYR STRL (GAUZE/BANDAGES/DRESSINGS)
BNDG CMPR 9X4 STRL LF SNTH (GAUZE/BANDAGES/DRESSINGS)
BNDG COHESIVE 1X5 TAN STRL LF (GAUZE/BANDAGES/DRESSINGS) IMPLANT
BNDG COHESIVE 2X5 TAN ST LF (GAUZE/BANDAGES/DRESSINGS) IMPLANT
BNDG COHESIVE 4X5 TAN ST LF (GAUZE/BANDAGES/DRESSINGS) ×3 IMPLANT
BNDG CONFORM 2 STRL LF (GAUZE/BANDAGES/DRESSINGS) IMPLANT
BNDG ESMARK 4X9 LF (GAUZE/BANDAGES/DRESSINGS) IMPLANT
BNDG GAUZE 1X2.1 STRL (MISCELLANEOUS) IMPLANT
BNDG GAUZE ELAST 4 BULKY (GAUZE/BANDAGES/DRESSINGS) ×3 IMPLANT
CHLORAPREP W/TINT 26 (MISCELLANEOUS) ×3 IMPLANT
CORD BIPOLAR FORCEPS 12FT (ELECTRODE) ×3 IMPLANT
COVER BACK TABLE 60X90IN (DRAPES) ×3 IMPLANT
COVER MAYO STAND STRL (DRAPES) ×3 IMPLANT
CUFF TOURN SGL QUICK 18X4 (TOURNIQUET CUFF) IMPLANT
DRAIN PENROSE .5X12 LATEX STL (DRAIN) IMPLANT
DRAPE EXTREMITY T 121X128X90 (DISPOSABLE) ×3 IMPLANT
DRAPE SURG 17X23 STRL (DRAPES) ×3 IMPLANT
DRSG EMULSION OIL 3X3 NADH (GAUZE/BANDAGES/DRESSINGS) ×3 IMPLANT
ELECT REM PT RETURN 9FT ADLT (ELECTROSURGICAL)
ELECTRODE REM PT RTRN 9FT ADLT (ELECTROSURGICAL) IMPLANT
GAUZE SPONGE 4X4 12PLY STRL LF (GAUZE/BANDAGES/DRESSINGS) ×3 IMPLANT
GLOVE SRG 8 PF TXTR STRL LF DI (GLOVE) ×2 IMPLANT
GLOVE SURG ENC MOIS LTX SZ7.5 (GLOVE) ×3 IMPLANT
GLOVE SURG LTX SZ6.5 (GLOVE) ×3 IMPLANT
GLOVE SURG UNDER POLY LF SZ7 (GLOVE) ×3 IMPLANT
GLOVE SURG UNDER POLY LF SZ8 (GLOVE) ×3
GOWN STRL REUS W/ TWL LRG LVL3 (GOWN DISPOSABLE) ×4 IMPLANT
GOWN STRL REUS W/TWL LRG LVL3 (GOWN DISPOSABLE) ×6
GOWN STRL REUS W/TWL XL LVL3 (GOWN DISPOSABLE) ×3 IMPLANT
LOOP VESSEL MAXI BLUE (MISCELLANEOUS) IMPLANT
LOOP VESSEL MINI RED (MISCELLANEOUS) IMPLANT
NDL HYPO 25X1 1.5 SAFETY (NEEDLE) IMPLANT
NDL SAFETY ECLIPSE 18X1.5 (NEEDLE) IMPLANT
NEEDLE HYPO 18GX1.5 SHARP (NEEDLE)
NEEDLE HYPO 25X1 1.5 SAFETY (NEEDLE) IMPLANT
NS IRRIG 1000ML POUR BTL (IV SOLUTION) ×3 IMPLANT
PACK BASIN DAY SURGERY FS (CUSTOM PROCEDURE TRAY) ×3 IMPLANT
PADDING CAST ABS 3INX4YD NS (CAST SUPPLIES)
PADDING CAST ABS 4INX4YD NS (CAST SUPPLIES) ×1
PADDING CAST ABS COTTON 3X4 (CAST SUPPLIES) IMPLANT
PADDING CAST ABS COTTON 4X4 ST (CAST SUPPLIES) ×2 IMPLANT
PENCIL SMOKE EVACUATOR (MISCELLANEOUS) IMPLANT
SLEEVE SCD COMPRESS KNEE MED (STOCKING) IMPLANT
SLING ARM FOAM STRAP LRG (SOFTGOODS) IMPLANT
SPEAR EYE SURG WECK-CEL (MISCELLANEOUS) ×3 IMPLANT
SPIKE FLUID TRANSFER (MISCELLANEOUS) IMPLANT
SPLINT FAST PLASTER 5X30 (CAST SUPPLIES)
SPLINT PLASTER CAST FAST 5X30 (CAST SUPPLIES) IMPLANT
SPLINT PLASTER CAST XFAST 3X15 (CAST SUPPLIES) IMPLANT
SPLINT PLASTER XTRA FASTSET 3X (CAST SUPPLIES)
STOCKINETTE 6  STRL (DRAPES) ×1
STOCKINETTE 6 STRL (DRAPES) ×2 IMPLANT
SUT ETHIBOND 3-0 V-5 (SUTURE) ×3 IMPLANT
SUT ETHILON 8 0 BV130 4 (SUTURE) ×3 IMPLANT
SUT ETHILON 9 0 V 100.4 (SUTURE) ×3 IMPLANT
SUT FIBERWIRE 2-0 18 17.9 3/8 (SUTURE)
SUT NYLON 9 0 VRM6 (SUTURE) IMPLANT
SUT PROLENE 6 0 P 1 18 (SUTURE) ×3 IMPLANT
SUT SILK 4 0 PS 2 (SUTURE) IMPLANT
SUT STEEL 4 (SUTURE) ×3 IMPLANT
SUT VICRYL RAPIDE 4-0 (SUTURE) IMPLANT
SUT VICRYL RAPIDE 4/0 PS 2 (SUTURE) ×3 IMPLANT
SUTURE FIBERWR 2-0 18 17.9 3/8 (SUTURE) IMPLANT
SYR 10ML LL (SYRINGE) IMPLANT
SYR BULB EAR ULCER 3OZ GRN STR (SYRINGE) ×3 IMPLANT
TOWEL GREEN STERILE FF (TOWEL DISPOSABLE) ×3 IMPLANT
TUBE CONNECTING 20X1/4 (TUBING) IMPLANT
UNDERPAD 30X36 HEAVY ABSORB (UNDERPADS AND DIAPERS) ×3 IMPLANT

## 2021-08-10 NOTE — Progress Notes (Signed)
Assisted Dr. Elgie Congo with right, ultrasound guided, supraclavicular block. Side rails up, monitors on throughout procedure. See vital signs in flow sheet. Tolerated Procedure well. ?

## 2021-08-10 NOTE — Anesthesia Procedure Notes (Signed)
Anesthesia Regional Block: Supraclavicular block  ? ?Pre-Anesthetic Checklist: , timeout performed,  Correct Patient, Correct Site, Correct Laterality,  Correct Procedure, Correct Position, site marked,  Risks and benefits discussed,  Surgical consent,  Pre-op evaluation,  At surgeon's request and post-op pain management ? ?Laterality: Right ? ?Prep: chloraprep     ?  ?Needles:  ?Injection technique: Single-shot ? ?Needle Type: Echogenic Stimulator Needle   ? ? ?Needle Length: 10cm  ?Needle Gauge: 20  ? ? ? ?Additional Needles: ? ? ?Procedures:,,,, ultrasound used (permanent image in chart),,    ?Narrative:  ?Start time: 08/10/2021 7:20 AM ?End time: 08/10/2021 7:25 AM ?Injection made incrementally with aspirations every 5 mL. ? ?Performed by: Personally  ?Anesthesiologist: Merlinda Frederick, MD ? ? ? ? ?

## 2021-08-10 NOTE — Anesthesia Procedure Notes (Signed)
Procedure Name: [place] ?Date/Time: 08/10/2021 8:38 AM ?Performed by: Suan Halter, CRNA ?Pre-anesthesia Checklist: Patient identified, Emergency Drugs available, Suction available, Patient being monitored and Timeout performed ?Patient Re-evaluated:Patient Re-evaluated prior to induction ?Oxygen Delivery Method: Simple face mask ? ? ? ? ?

## 2021-08-10 NOTE — Anesthesia Postprocedure Evaluation (Signed)
Anesthesia Post Note ? ?Patient: Aaron Lozano ? ?Procedure(s) Performed: RIGHT INDEX FINGER MASS EXCISION (Right: Hand) ? ?  ? ?Patient location during evaluation: PACU ?Anesthesia Type: Regional and MAC ?Level of consciousness: awake and alert ?Pain management: pain level controlled ?Vital Signs Assessment: post-procedure vital signs reviewed and stable ?Respiratory status: spontaneous breathing and respiratory function stable ?Cardiovascular status: stable ?Postop Assessment: no apparent nausea or vomiting ?Anesthetic complications: no ? ? ?No notable events documented. ? ?Last Vitals:  ?Vitals:  ? 08/10/21 0900 08/10/21 0915  ?BP: (!) 158/88 (!) 160/93  ?Pulse: (!) 57 65  ?Resp: (!) 0 18  ?Temp:    ?SpO2: 98% 96%  ?  ?Last Pain:  ?Vitals:  ? 08/10/21 0913  ?TempSrc:   ?PainSc: 6   ? ? ?  ?  ?  ?  ?  ?  ? ?Merlinda Frederick ? ? ? ? ?

## 2021-08-10 NOTE — Interval H&P Note (Signed)
History and Physical Interval Note: ? ?08/10/2021 ?7:35 AM ? ?Aaron Lozano  has presented today for surgery, with the diagnosis of RIGHT INDEX FINGER MASS.  The various methods of treatment have been discussed with the patient and family. After consideration of risks, benefits and other options for treatment, the patient has consented to  Procedure(s) with comments: ?RIGHT INDEX FINGER MASS EXCISION (Right) - PRE-OP BLOCK ?LENGHT OF SURGERY: 60 MINUTES ?POSSIBLE NERVE GRAFTING (Right) - PRE-OP BLOCK ?LENGHT OF SURGERY: 66 MINUTES as a surgical intervention.  The patient's history has been reviewed, patient examined, no change in status, stable for surgery.  I have reviewed the patient's chart and labs.  Questions were answered to the patient's satisfaction.   ? ? ?Jolyn Nap ? ? ?

## 2021-08-10 NOTE — Discharge Instructions (Addendum)
Discharge Instructions ? ? ?You have a light dressing on your hand.  ?You may begin gentle motion of your fingers and hand immediately, but you should not do any heavy lifting or gripping.  Elevate your hand to reduce pain & swelling of the digits.  Ice over the operative site may be helpful to reduce pain & swelling.  DO NOT USE HEAT. ?Pain medicine has been prescribed for you.  ?Take Tylenol 650 mg and Ibuprofen 600 mg every 6 hours. Take the Oxycodone as a rescue medicine for severe post operative pain. Continue gabapentin as prescribed. ?Leave the dressing in place until the third day after your surgery and then remove it, leaving it open to air.  ?After the bandage has been removed you may shower, regularly washing the incision and letting the water run over it, but not submerging it (no swimming, soaking it in dishwater, etc.) ?You may drive a car when you are off of prescription pain medications and can safely control your vehicle with both hands. ?We will address whether therapy will be required or not when you return to the office. ?You may have already made your follow-up appointment when we completed your preop visit.  If not, please call our office today or the next business day to make your return appointment for 10-15 days after surgery. ? ? ?Please call [phone] during normal business hours or [phone] after hours for any problems. Including the following: ? ?- excessive redness of the incisions ?- drainage for more than 4 days ?- fever of more than 101.5 F ? ?*Please note that pain medications will not be refilled after hours or on weekends.  ? ?Work Status: You may return to work 08/11/21 with no lifting gripping or grasping greater than pencil and paper tasks until first post operative appointment. ? ? ?Post Anesthesia Home Care Instructions ? ?Activity: ?Get plenty of rest for the remainder of the day. A responsible individual must stay with you for 24 hours following the procedure.  ?For  the next 24 hours, DO NOT: ?-Drive a car ?-Paediatric nurse ?-Drink alcoholic beverages ?-Take any medication unless instructed by your physician ?-Make any legal decisions or sign important papers. ? ?Meals: ?Start with liquid foods such as gelatin or soup. Progress to regular foods as tolerated. Avoid greasy, spicy, heavy foods. If nausea and/or vomiting occur, drink only clear liquids until the nausea and/or vomiting subsides. Call your physician if vomiting continues. ? ?Special Instructions/Symptoms: ?Your throat may feel dry or sore from the anesthesia or the breathing tube placed in your throat during surgery. If this causes discomfort, gargle with warm salt water. The discomfort should disappear within 24 hours. ? ?If you had a scopolamine patch placed behind your ear for the management of post- operative nausea and/or vomiting: ? ?1. The medication in the patch is effective for 72 hours, after which it should be removed.  Wrap patch in a tissue and discard in the trash. Wash hands thoroughly with soap and water. ?2. You may remove the patch earlier than 72 hours if you experience unpleasant side effects which may include dry mouth, dizziness or visual disturbances. ?3. Avoid touching the patch. Wash your hands with soap and water after contact with the patch. ?    ?Regional Anesthesia Blocks ? ?1. Numbness or the inability to move the "blocked" extremity may last from 3-48 hours after placement. The length of time depends on the medication injected and your individual response to the medication. If the numbness is  not going away after 48 hours, call your surgeon. ? ?2. The extremity that is blocked will need to be protected until the numbness is gone and the  Strength has returned. Because you cannot feel it, you will need to take extra care to avoid injury. Because it may be weak, you may have difficulty moving it or using it. You may not know what position it is in without looking at it while the block  is in effect. ? ?3. For blocks in the legs and feet, returning to weight bearing and walking needs to be done carefully. You will need to wait until the numbness is entirely gone and the strength has returned. You should be able to move your leg and foot normally before you try and bear weight or walk. You will need someone to be with you when you first try to ensure you do not fall and possibly risk injury. ? ?4. Bruising and tenderness at the needle site are common side effects and will resolve in a few days. ? ?5. Persistent numbness or new problems with movement should be communicated to the surgeon or the [facility] [phone] [place] [phone]).  ?

## 2021-08-10 NOTE — Op Note (Signed)
08/10/2021 ? ?8:06 AM ? ?PATIENT:  Aaron Lozano  51 y.o. male ? ?PRE-OPERATIVE DIAGNOSIS:  R IF painful soft tissue mass ? ?POST-OPERATIVE DIAGNOSIS:  Same ? ?PROCEDURE:  R IF soft tissue mass excision, 1.5 cm ? ?SURGEON: Rayvon Char. Grandville Silos, MD ? ?PHYSICIAN ASSISTANT: Morley Kos, OPA-C ? ?ANESTHESIA:  regional and MAC ? ?SPECIMENS:  mass to pathology ? ?DRAINS:   None ? ?EBL:  <10 mL ? ?PREOPERATIVE INDICATIONS:  Aaron Lozano is a  51 y.o. male with a painful R IF soft-tissue mass suspected to be of neuro origin, possibly arising from the dorsal branch of the ulnar digital nerve ? ?The risks benefits and alternatives were discussed with the patient preoperatively including but not limited to the risks of infection, bleeding, nerve injury, cardiopulmonary complications, the need for revision surgery, among others, and the patient verbalized understanding and consented to proceed. ? ?OPERATIVE IMPLANTS: None ? ?OPERATIVE PROCEDURE:  After receiving prophylactic antibiotics & a regional block, the patient was escorted to the operative theatre and placed in a supine position.  A surgical ?time-out? was performed during which the planned procedure, proposed operative site, and the correct patient identity were compared to the operative consent and agreement confirmed by the circulating nurse according to current facility policy.  Following application of a tourniquet to the operative extremity, the exposed skin was prepped with Chloraprep and draped in the usual sterile fashion.  The limb was exsanguinated with an Esmarch bandage and the tourniquet inflated to approximately 155mHg higher than systolic BP. ? ?An ulnar mid axial incision was marked and made sharply.  Subcutaneous tissues were dissected with a combination of blunt and spreading dissection.  The mass was encountered, and it was bilobed with 1 lobe being somewhat pointed rather than rounded.  It actually was of a smooth glistening appearance, firm, pale, and  lack significant soft tissue coverage.  It was dissected free rather easily, but it was in close approximation to the dorsal digital nerve branch.  Did not appear to arise directly from it however.  Once removed, the nerve was found to be intact.  The tourniquet was released, additional hemostasis obtained with bipolar electrocautery and it was irrigated.  The skin was closed with 4-0 Vicryl Rapide running horizontal mattress suture and a dressing was applied.  He was taken to the recovery room in stable condition. ? ?DISPOSITION: He will be discharged home today with typical instructions, returning in 10 to 15 days. ? ? ? ? ? ? ? ? ? ?

## 2021-08-10 NOTE — Anesthesia Preprocedure Evaluation (Addendum)
Anesthesia Evaluation  ?Patient identified by MRN, date of birth, ID band ?Patient awake ? ? ? ?Reviewed: ?Allergy & Precautions, NPO status , Patient's Chart, lab work & pertinent test results ? ?Airway ?Mallampati: II ? ?TM Distance: >3 FB ?Neck ROM: Full ? ? ? Dental ?no notable dental hx. ? ?  ?Pulmonary ?neg pulmonary ROS,  ?  ?Pulmonary exam normal ?breath sounds clear to auscultation ? ? ? ? ? ? Cardiovascular ?Exercise Tolerance: Good ?negative cardio ROS ?Normal cardiovascular exam ?Rhythm:Regular Rate:Normal ? ? ?  ?Neuro/Psych ?negative neurological ROS ? negative psych ROS  ? GI/Hepatic ?Neg liver ROS, H/o gastric bypass surgery ?  ?Endo/Other  ?negative endocrine ROS ? Renal/GU ?negative Renal ROS  ?negative genitourinary ?  ?Musculoskeletal ? ?(+) Arthritis ,  ? Abdominal ?  ?Peds ?negative pediatric ROS ?(+)  Hematology ?negative hematology ROS ?(+)   ?Anesthesia Other Findings ? ? Reproductive/Obstetrics ?negative OB ROS ? ?  ? ? ? ? ? ? ? ? ? ? ? ? ? ?  ?  ? ? ? ? ? ? ? ?Anesthesia Physical ?Anesthesia Plan ? ?ASA: 2 ? ?Anesthesia Plan: MAC and Regional  ? ?Post-op Pain Management: Tylenol PO (pre-op)*  ? ?Induction: Intravenous ? ?PONV Risk Score and Plan: 1 and Propofol infusion, Treatment may vary due to age or medical condition, TIVA and Ondansetron ? ?Airway Management Planned: Natural Airway and Simple Face Mask ? ?Additional Equipment: None ? ?Intra-op Plan:  ? ?Post-operative Plan: Extubation in OR ? ?Informed Consent: I have reviewed the patients History and Physical, chart, labs and discussed the procedure including the risks, benefits and alternatives for the proposed anesthesia with the patient or authorized representative who has indicated his/her understanding and acceptance.  ? ? ? ?Dental advisory given ? ?Plan Discussed with: CRNA, Anesthesiologist and Surgeon ? ?Anesthesia Plan Comments:   ? ? ? ? ? ?Anesthesia Quick Evaluation ? ?

## 2021-08-10 NOTE — Transfer of Care (Signed)
Immediate Anesthesia Transfer of Care Note ? ?Patient: Aaron Lozano ? ?Procedure(s) Performed: RIGHT INDEX FINGER MASS EXCISION (Right: Hand) ? ?Patient Location: PACU ? ?Anesthesia Type:MAC combined with regional for post-op pain ? ?Level of Consciousness: awake, alert , oriented and patient cooperative ? ?Airway & Oxygen Therapy: Patient Spontanous Breathing and Patient connected to face mask oxygen ? ?Post-op Assessment: Report given to RN and Post -op Vital signs reviewed and stable ? ?Post vital signs: Reviewed and stable ? ?Last Vitals:  ?Vitals Value Taken Time  ?BP    ?Temp    ?Pulse 65 08/10/21 0852  ?Resp    ?SpO2 96 % 08/10/21 0852  ?Vitals shown include unvalidated device data. ? ?Last Pain:  ?Vitals:  ? 08/10/21 0704  ?TempSrc: Oral  ?PainSc: 4   ?   ? ?Patients Stated Pain Goal: 6 (08/10/21 0704) ? ?Complications: No notable events documented. ?

## 2021-08-11 LAB — SURGICAL PATHOLOGY

## 2021-08-12 ENCOUNTER — Encounter (HOSPITAL_BASED_OUTPATIENT_CLINIC_OR_DEPARTMENT_OTHER): Payer: Self-pay | Admitting: Orthopedic Surgery

## 2021-09-11 ENCOUNTER — Telehealth: Payer: Self-pay | Admitting: Plastic Surgery

## 2021-09-11 NOTE — Telephone Encounter (Signed)
Returned patient's call regarding denial. Advised that I do not have denial in my hands yet but I will follow up with insurance to find out what the reason for denial was and if we have room for an appeal and once I know , I will reach out to him to discuss. Date tenatively set for 6/23 so we have room to appeal . Patient understands.  ?

## 2021-09-15 ENCOUNTER — Telehealth: Payer: Self-pay

## 2021-09-15 ENCOUNTER — Other Ambulatory Visit: Payer: Self-pay | Admitting: Nurse Practitioner

## 2021-09-15 ENCOUNTER — Ambulatory Visit (INDEPENDENT_AMBULATORY_CARE_PROVIDER_SITE_OTHER): Payer: BC Managed Care – PPO | Admitting: Nurse Practitioner

## 2021-09-15 ENCOUNTER — Ambulatory Visit: Payer: BC Managed Care – PPO | Admitting: Family Medicine

## 2021-09-15 ENCOUNTER — Encounter: Payer: Self-pay | Admitting: Nurse Practitioner

## 2021-09-15 VITALS — BP 140/82 | HR 81 | Ht 69.0 in | Wt 213.0 lb

## 2021-09-15 DIAGNOSIS — F319 Bipolar disorder, unspecified: Secondary | ICD-10-CM | POA: Diagnosis not present

## 2021-09-15 DIAGNOSIS — N529 Male erectile dysfunction, unspecified: Secondary | ICD-10-CM | POA: Insufficient documentation

## 2021-09-15 DIAGNOSIS — I10 Essential (primary) hypertension: Secondary | ICD-10-CM | POA: Diagnosis not present

## 2021-09-15 DIAGNOSIS — Z7289 Other problems related to lifestyle: Secondary | ICD-10-CM | POA: Insufficient documentation

## 2021-09-15 DIAGNOSIS — K4090 Unilateral inguinal hernia, without obstruction or gangrene, not specified as recurrent: Secondary | ICD-10-CM

## 2021-09-15 DIAGNOSIS — Z1211 Encounter for screening for malignant neoplasm of colon: Secondary | ICD-10-CM

## 2021-09-15 MED ORDER — SILDENAFIL CITRATE 20 MG PO TABS: ORAL_TABLET | ORAL | 0 refills | Status: DC

## 2021-09-15 MED ORDER — SILDENAFIL CITRATE 20 MG PO TABS: ORAL_TABLET | ORAL | 1 refills | Status: DC

## 2021-09-15 MED ORDER — BUPROPION HCL ER (XL) 150 MG PO TB24: 150.0000 mg | ORAL_TABLET | Freq: Every day | ORAL | 0 refills | Status: DC

## 2021-09-15 NOTE — Progress Notes (Signed)
? ?New Patient Office Visit ? ?Subjective   ? ?Patient ID: Aaron Lozano, male    DOB: 1971-05-08  Age: 51 y.o. MRN: [id number] ? ?CC:  ?Chief Complaint  ?Patient presents with  ? New Patient (Initial Visit)  ?  NP  ? Fatigue  ?  For the last couple of weeks around 4/11  ? ? ?HPI ?Aaron Lozano with past medical history of hypertension, bipolar with depression, gastric bypass, GERD ,umbilical hernia without obstruction and without gangrene, inguinal hernia, erectile dysfunction presents to establish care. Moved from [place] about a year ago. Previous PCP Aaron Lozano, last visit was about 6 months ago.  ?  ?Has an upcoming surgery for inguinal  hernia repair on June 23. Has right groin pain, hernia comes out with sneezing and coughing . Has had 2 hernia repairs on his left side previously .  ? ?He was previously on BP meds but after having gastric by pass surgery he was able to get off medications.  Patient denies chest pain, shortness of breath, edema, syncope ? ?Erectile dysfunction he was on testosterone injection, has run out of medications, and he has been feeling tired since running out of his testosterone injection .  Takes sildenafil 20 mg every other day as needed for erectile dysfunction.  Patient denies dysuria urinary hesitancy, frequency ? ?Has been vaping daily since about a year ago.  Denies ever using tobacco.  Need to quit vaping including risk of upper respiratory diseases discussed with patient, verbalized understanding ? ?States he has had TDAP vaccine in the past 10 years , needs second dose of shingles vaccine , needs covid booster vaccine.  ? ?Bipolar with depression  . Was on wellbutrinXL '300mg'$  he has been out of medications for some months now and would like to start back.  Denies SI, HI, PHQ-9 score was 0 today ? ?Patient told to sign a release of information form to request for his records from previous PCP.  ? ?Outpatient Encounter Medications as of 09/15/2021  ?Medication Sig  ?  acetaminophen (TYLENOL) 325 MG tablet Take 2 tablets (650 mg total) by mouth every 6 (six) hours.  ? buPROPion (WELLBUTRIN XL) 150 MG 24 hr tablet Take 1 tablet (150 mg total) by mouth daily.  ? ibuprofen (ADVIL) 200 MG tablet Take 3 tablets (600 mg total) by mouth every 6 (six) hours.  ? [DISCONTINUED] sildenafil (REVATIO) 20 MG tablet Take 5 tablets by mouth every other day as needed.  ? oxyCODONE (ROXICODONE) 5 MG immediate release tablet Take 1 tablet (5 mg total) by mouth every 6 (six) hours as needed for severe pain (severe postop pain). (Patient not taking: Reported on 09/15/2021)  ? sildenafil (REVATIO) 20 MG tablet Take 5 tablets by mouth every other day as needed.  ? ?No facility-administered encounter medications on file as of 09/15/2021.  ? ? ?History reviewed. No pertinent past medical history. ? ?Past Surgical History:  ?Procedure Laterality Date  ? ANKLE SURGERY    ? BACK SURGERY    ? EXCISION METACARPAL MASS Right 08/10/2021  ? Procedure: RIGHT INDEX FINGER MASS EXCISION;  Surgeon: Milly Jakob, MD;  Location: [facility];  Service: Orthopedics;  Laterality: Right;  PRE-OP BLOCK ?LENGHT OF SURGERY: 60 MINUTES  ? GASTRIC BYPASS    ? INGUINAL HERNIA REPAIR Left 1988  ? INGUINAL HERNIA REPAIR  2006  ? SHOULDER SURGERY    ? ? ?Family History  ?Problem Relation Age of Onset  ? Stroke Mother   ?  Heart attack Father   ? Colon cancer Maternal Grandmother   ? Lung cancer Neg Hx   ? Prostate cancer Neg Hx   ? ? ?Social History  ? ?Socioeconomic History  ? Marital status: Divorced  ?  Spouse name: Not on file  ? Number of children: 2  ? Years of education: Not on file  ? Highest education level: Not on file  ?Occupational History  ? Not on file  ?Tobacco Use  ? Smoking status: Never  ? Smokeless tobacco: Current  ?Vaping Use  ? Vaping Use: Every day  ?Substance and Sexual Activity  ? Alcohol use: Not Currently  ? Drug use: Never  ? Sexual activity: Not on file  ?Other Topics Concern  ? Not on file   ?Social History Narrative  ? Lives with his fiance. Work for an [employer]   ? ?Social Determinants of Health  ? ?Financial Resource Strain: Not on file  ?Food Insecurity: Not on file  ?Transportation Needs: Not on file  ?Physical Activity: Not on file  ?Stress: Not on file  ?Social Connections: Not on file  ?Intimate Partner Violence: Not on file  ? ? ?Review of Systems  ?Constitutional: Negative.   ?Respiratory: Negative.    ?Cardiovascular: Negative.   ?Gastrointestinal:  Positive for abdominal pain. Negative for blood in stool, constipation, diarrhea, heartburn, melena, nausea and vomiting.  ?Neurological: Negative.   ?Psychiatric/Behavioral: Negative.    ? ?  ? ? ?Objective   ? ?BP 140/82 (BP Location: Right Arm, Cuff Size: Large)   Pulse 81   Ht '5\' 9"'$  (1.753 m)   Wt 213 lb (96.6 kg)   SpO2 97%   BMI 31.45 kg/m?  ? ?Physical Exam ?Constitutional:   ?   General: He is not in acute distress. ?   Appearance: He is obese. He is not ill-appearing, toxic-appearing or diaphoretic.  ?Cardiovascular:  ?   Rate and Rhythm: Normal rate and regular rhythm.  ?   Pulses: Normal pulses.  ?   Heart sounds: Normal heart sounds. No murmur heard. ?  No friction rub. No gallop.  ?Pulmonary:  ?   Effort: Pulmonary effort is normal. No respiratory distress.  ?   Breath sounds: Normal breath sounds. No stridor. No wheezing, rhonchi or rales.  ?Chest:  ?   Chest wall: No tenderness.  ?Abdominal:  ?   Palpations: Abdomen is soft.  ?   Tenderness: There is no abdominal tenderness. There is no guarding.  ?Skin: ?   General: Skin is warm and dry.  ?   Capillary Refill: Capillary refill takes less than 2 seconds.  ?Neurological:  ?   Mental Status: He is alert.  ?Psychiatric:     ?   Mood and Affect: Mood normal.     ?   Behavior: Behavior normal.     ?   Thought Content: Thought content normal.     ?   Judgment: Judgment normal.  ? ? ? ?  ? ?Assessment & Plan:  ? ?Problem List Items Addressed This Visit   ? ?  ?  Cardiovascular and Mediastinum  ? High blood pressure  ?   ?BP Readings from Last 3 Encounters:  ?09/15/21 140/82  ?08/10/21 (!) 146/96  ?07/29/21 (!) 148/87  ?Not currently on medication ?DASH diet advised, engage in regular daily exercises at least 150 minutes weekly. ?I discussed the need to start patient on medication at next visit if he is blood pressure remains elevated.  ?  ?  ?  Relevant Medications  ? sildenafil (REVATIO) 20 MG tablet  ?  ? Other  ? Bipolar 1 disorder, depressed ([place])  ?  He was on Wellbutrin XL 300 mg daily but has been out of medication for months. ?Start bupropion XL 150 mg daily, increase dose to 300 mg daily after 2 weeks if  needed to control his bipolar.  ?Denies SI, HI ?PHQ-9 score 0 ? ?  ?  ? Relevant Medications  ? buPROPion (WELLBUTRIN XL) 150 MG 24 hr tablet  ? Erectile dysfunction  ?  Chronic condition ?On sildenafil 20 mg every other day as needed ?Medication refilled today ?States that he was on testosterone injection.  I discussed with patient that I do not manage hormone therapy.  I encouraged patient to find all doctor that would be willing to continue his testosterone injection patient verbalized understanding ? ?  ?  ? Relevant Medications  ? sildenafil (REVATIO) 20 MG tablet  ? Inguinal hernia, right  ?  Has upcoming surgery for hernia repair in June. ?Currently denies pain.  ?Patient encouraged to maintain close follow-up with surgery.  ? ?  ?  ? Current every day vaping  ?  Has been vaping daily since about a year ago.  Denies ever using tobacco.  Need to quit vaping including risk of upper respiratory diseases discussed with patient, verbalized understanding. ?On wellbutrin XL for bipolar, wellbutrin XL '150mg'$   med reordered today  ?  ?  ? ?Other Visit Diagnoses   ? ? Screening for colon cancer    -  Primary  ? Relevant Orders  ? Cologuard  ? ?  ? ? ?Return in about 3 months (around 12/15/2021).  ? ?Renee Rival, FNP ? ? ?

## 2021-09-15 NOTE — Assessment & Plan Note (Addendum)
Has been vaping daily since about a year ago.  Denies ever using tobacco.  Need to quit vaping including risk of upper respiratory diseases discussed with patient, verbalized understanding. ?On wellbutrin XL for bipolar, wellbutrin XL '150mg'$   med reordered today  ?

## 2021-09-15 NOTE — Telephone Encounter (Signed)
Patient called said the sildenafil (REVATIO) 20 MG tablet  ?Should have been 75 pills not 10 pills, so it can last 15 days. ? ?Insurance to cover needs to be written for 75 pills that should have been.  5 pills for every other day for 15 days.  ? ?Pharmacy ? ?[id number] [address] ?[id number] ?Phone:  [phone] Fax:  [phone]  ? ?

## 2021-09-15 NOTE — Assessment & Plan Note (Addendum)
Has upcoming surgery for hernia repair in June. ?Currently denies pain.  ?Patient encouraged to maintain close follow-up with surgery.  ?

## 2021-09-15 NOTE — Telephone Encounter (Signed)
Please advise 

## 2021-09-15 NOTE — Assessment & Plan Note (Signed)
?  BP Readings from Last 3 Encounters:  ?09/15/21 140/82  ?08/10/21 (!) 146/96  ?07/29/21 (!) 148/87  ?Not currently on medication ?DASH diet advised, engage in regular daily exercises at least 150 minutes weekly. ?I discussed the need to start patient on medication at next visit if he is blood pressure remains elevated.  ?

## 2021-09-15 NOTE — Patient Instructions (Addendum)
?  Please start taking wellbutrin '150mg'$  daily , you may increase the dose to 300 mg daily after 2 weeks if needed as tolerated.  ? ? ? ?It is important that you exercise regularly at least 30 minutes 5 times a week.  ?Think about what you will eat, plan ahead. ?Choose " clean, green, fresh or frozen" over canned, processed or packaged foods which are more sugary, salty and fatty. ?70 to 75% of food eaten should be vegetables and fruit. ?Three meals at set times with snacks allowed between meals, but they must be fruit or vegetables. ?Aim to eat over a 12 hour period , example 7 am to 7 pm, and STOP after  your last meal of the day. ?Drink water,generally about 64 ounces per day, no other drink is as healthy. Fruit juice is best enjoyed in a healthy way, by EATING the fruit. ? ?Thanks for choosing [facility], we consider it a privelige to serve you.  ?

## 2021-09-15 NOTE — Assessment & Plan Note (Signed)
Chronic condition ?On sildenafil 20 mg every other day as needed ?Medication refilled today ?States that he was on testosterone injection.  I discussed with patient that I do not manage hormone therapy.  I encouraged patient to find all doctor that would be willing to continue his testosterone injection patient verbalized understanding ?

## 2021-09-15 NOTE — Assessment & Plan Note (Signed)
He was on Wellbutrin XL 300 mg daily but has been out of medication for months. ?Start bupropion XL 150 mg daily, increase dose to 300 mg daily after 2 weeks if  needed to control his bipolar.  ?Denies SI, HI ?PHQ-9 score 0 ?

## 2021-09-22 ENCOUNTER — Telehealth: Payer: Self-pay

## 2021-09-22 NOTE — Telephone Encounter (Signed)
Returned patients call. Advised insurance denied authorization due to surgery is not a covered benefit, and you can not appeal the decision. Patient was open with a cosmetic quote. Will send via MyChart and follow up. ?

## 2021-09-23 ENCOUNTER — Telehealth: Payer: Self-pay

## 2021-09-23 NOTE — Telephone Encounter (Signed)
error 

## 2021-09-29 ENCOUNTER — Encounter (HOSPITAL_COMMUNITY): Payer: Self-pay | Admitting: Surgery

## 2021-09-29 NOTE — Progress Notes (Addendum)
For Short Stay: ?[place] appointment date: ?Date of COVID positive in last 90 days: ? ?Bowel Prep reminder: ? ? ?For Anesthesia: ?PCP - Renee Rival, FNP last office visit note 09/15/21 in epic ?Cardiologist -  ? ?Chest x-ray -  ?EKG -  ?Stress Test -  ?ECHO -  ?Cardiac Cath -  ?Pacemaker/ICD device last checked: ?Pacemaker orders received: ?Device Rep notified: ? ?Spinal Cord Stimulator: ? ?Sleep Study -  ?CPAP -  ? ?Fasting Blood Sugar -  ?Checks Blood Sugar _____ times a day ?Date and result of last Hgb A1c- ? ?Blood Thinner Instructions: ?Aspirin Instructions: ?Last Dose: ? ?Activity level: Can go up a flight of stairs and activities of daily living without stopping and without chest pain and/or shortness of breath ?  Able to exercise without chest pain and/or shortness of breath ?  Unable to go up a flight of stairs without chest pain and/or shortness of breath ?   ? ?Anesthesia review:  ? ?Patient denies shortness of breath, fever, cough and chest pain at PAT appointment ? ? ?Patient verbalized understanding of instructions that were given to them at the PAT appointment. Patient was also instructed that they will need to review over the PAT instructions again at home before surgery.  ?

## 2021-09-30 ENCOUNTER — Other Ambulatory Visit: Payer: Self-pay

## 2021-09-30 ENCOUNTER — Ambulatory Visit: Payer: Self-pay | Admitting: Surgery

## 2021-09-30 ENCOUNTER — Encounter (HOSPITAL_COMMUNITY): Payer: Self-pay | Admitting: Surgery

## 2021-09-30 NOTE — Progress Notes (Signed)
Pt. Had high BP reading at last MD visit. Stated he had 2 energy drinks before the appointment and some white coat syndrome. Pt. States his BP at home runs 130-135/80-85. ?

## 2021-10-04 NOTE — Anesthesia Preprocedure Evaluation (Addendum)
Anesthesia Evaluation  ?Patient identified by MRN, date of birth, ID band ?Patient awake ? ? ? ?Reviewed: ?Allergy & Precautions, H&P , NPO status , Patient's Chart, lab work & pertinent test results ? ?Airway ?Mallampati: II ? ?TM Distance: >3 FB ?Neck ROM: Full ? ? ? Dental ?no notable dental hx. ?(+) Teeth Intact, Dental Advisory Given ?  ?Pulmonary ?neg pulmonary ROS,  ?  ?Pulmonary exam normal ?breath sounds clear to auscultation ? ? ? ? ? ? Cardiovascular ?Exercise Tolerance: Good ?hypertension, negative cardio ROS ?Normal cardiovascular exam ?Rhythm:Regular Rate:Normal ? ? ?  ?Neuro/Psych ?PSYCHIATRIC DISORDERS Bipolar Disorder negative neurological ROS ? negative psych ROS  ? GI/Hepatic ?negative GI ROS, Neg liver ROS,   ?Endo/Other  ?negative endocrine ROS ? Renal/GU ?negative Renal ROS  ?negative genitourinary ?  ?Musculoskeletal ?negative musculoskeletal ROS ?(+) Arthritis , Osteoarthritis,   ? Abdominal ?  ?Peds ?negative pediatric ROS ?(+)  Hematology ?negative hematology ROS ?(+)   ?Anesthesia Other Findings ? ? Reproductive/Obstetrics ?negative OB ROS ? ?  ? ? ? ? ? ? ? ? ? ? ? ? ? ?  ?  ? ? ? ? ? ? ? ?Anesthesia Physical ?Anesthesia Plan ? ?ASA: 2 ? ?Anesthesia Plan: General  ? ?Post-op Pain Management: Tylenol PO (pre-op)* and Celebrex PO (pre-op)*  ? ?Induction: Intravenous ? ?PONV Risk Score and Plan: 2 and Ondansetron, Dexamethasone and Treatment may vary due to age or medical condition ? ?Airway Management Planned: Oral ETT and LMA ? ?Additional Equipment: None ? ?Intra-op Plan:  ? ?Post-operative Plan: Extubation in OR ? ?Informed Consent: I have reviewed the patients History and Physical, chart, labs and discussed the procedure including the risks, benefits and alternatives for the proposed anesthesia with the patient or authorized representative who has indicated his/her understanding and acceptance.  ? ? ? ? ? ?Plan Discussed with: Anesthesiologist and  CRNA ? ?Anesthesia Plan Comments: ( ? ?)  ? ? ? ? ? ?Anesthesia Quick Evaluation ? ?

## 2021-10-05 ENCOUNTER — Encounter (HOSPITAL_COMMUNITY)
Admission: RE | Disposition: A | Discharge status: Home / Self Care | Payer: Self-pay | Source: Home / Self Care | Attending: Surgery

## 2021-10-05 ENCOUNTER — Encounter (HOSPITAL_COMMUNITY): Payer: Self-pay | Admitting: Surgery

## 2021-10-05 ENCOUNTER — Ambulatory Visit (HOSPITAL_COMMUNITY)
Admission: RE | Admit: 2021-10-05 | Discharge: 2021-10-05 | Disposition: A | Discharge status: Home / Self Care | Payer: BC Managed Care – PPO | Attending: Surgery | Admitting: Surgery

## 2021-10-05 ENCOUNTER — Ambulatory Visit (HOSPITAL_COMMUNITY): Payer: BC Managed Care – PPO | Admitting: Physician Assistant

## 2021-10-05 ENCOUNTER — Other Ambulatory Visit: Payer: Self-pay

## 2021-10-05 DIAGNOSIS — K429 Umbilical hernia without obstruction or gangrene: Secondary | ICD-10-CM | POA: Insufficient documentation

## 2021-10-05 DIAGNOSIS — F319 Bipolar disorder, unspecified: Secondary | ICD-10-CM | POA: Diagnosis not present

## 2021-10-05 DIAGNOSIS — K4090 Unilateral inguinal hernia, without obstruction or gangrene, not specified as recurrent: Secondary | ICD-10-CM | POA: Insufficient documentation

## 2021-10-05 DIAGNOSIS — I10 Essential (primary) hypertension: Secondary | ICD-10-CM

## 2021-10-05 HISTORY — PX: UMBILICAL HERNIA REPAIR: SHX196

## 2021-10-05 HISTORY — DX: Unilateral inguinal hernia, without obstruction or gangrene, not specified as recurrent: K40.90

## 2021-10-05 HISTORY — DX: Pneumonia, unspecified organism: J18.9

## 2021-10-05 HISTORY — DX: Unspecified osteoarthritis, unspecified site: M19.90

## 2021-10-05 HISTORY — PX: INGUINAL HERNIA REPAIR: SHX194

## 2021-10-05 HISTORY — DX: Umbilical hernia without obstruction or gangrene: K42.9

## 2021-10-05 HISTORY — DX: Male erectile dysfunction, unspecified: N52.9

## 2021-10-05 LAB — CBC
HCT: 42.3 % (ref 39.0–52.0)
Hemoglobin: 14.6 g/dL (ref 13.0–17.0)
MCH: 29.2 pg (ref 26.0–34.0)
MCHC: 34.5 g/dL (ref 30.0–36.0)
MCV: 84.6 fL (ref 80.0–100.0)
Platelets: 178 10*3/uL (ref 150–400)
RBC: 5 MIL/uL (ref 4.22–5.81)
RDW: 12.8 % (ref 11.5–15.5)
WBC: 3.8 10*3/uL — ABNORMAL LOW (ref 4.0–10.5)
nRBC: 0 % (ref 0.0–0.2)

## 2021-10-05 LAB — BASIC METABOLIC PANEL WITH GFR
Anion gap: 6 (ref 5–15)
BUN: 18 mg/dL (ref 6–20)
CO2: 24 mmol/L (ref 22–32)
Calcium: 8.6 mg/dL — ABNORMAL LOW (ref 8.9–10.3)
Chloride: 107 mmol/L (ref 98–111)
Creatinine, Ser: 0.98 mg/dL (ref 0.61–1.24)
GFR, Estimated: >60 mL/min (ref >60)
Glucose, Bld: 91 mg/dL (ref 70–99)
Potassium: 3.6 mmol/L (ref 3.5–5.1)
Sodium: 137 mmol/L (ref 135–145)

## 2021-10-05 SURGERY — REPAIR, HERNIA, INGUINAL, LAPAROSCOPIC
Anesthesia: General

## 2021-10-05 MED ORDER — BUPIVACAINE-EPINEPHRINE (PF) 0.25% -1:200000 IJ SOLN
INTRAMUSCULAR | Status: AC
  Filled 2021-10-05: qty 30

## 2021-10-05 MED ORDER — HYDROMORPHONE HCL 1 MG/ML IJ SOLN
0.5000 mg | INTRAMUSCULAR | Status: AC
  Administered 2021-10-05 (×3): 0.5 mg via INTRAVENOUS

## 2021-10-05 MED ORDER — ACETAMINOPHEN 500 MG PO TABS: 1000.0000 mg | ORAL_TABLET | Freq: Once | ORAL | Status: DC

## 2021-10-05 MED ORDER — ACETAMINOPHEN 325 MG PO TABS: 325.0000 mg | ORAL_TABLET | ORAL | Status: DC | PRN

## 2021-10-05 MED ORDER — ONDANSETRON HCL 4 MG/2ML IJ SOLN
INTRAMUSCULAR | Status: AC
  Filled 2021-10-05: qty 2

## 2021-10-05 MED ORDER — FENTANYL CITRATE PF 50 MCG/ML IJ SOSY
25.0000 ug | PREFILLED_SYRINGE | INTRAMUSCULAR | Status: DC | PRN
  Administered 2021-10-05 (×3): 50 ug via INTRAVENOUS

## 2021-10-05 MED ORDER — ACETAMINOPHEN 500 MG PO TABS
1000.0000 mg | ORAL_TABLET | ORAL | Status: AC
  Administered 2021-10-05: 1000 mg via ORAL
  Filled 2021-10-05: qty 2

## 2021-10-05 MED ORDER — GABAPENTIN 300 MG PO CAPS
300.0000 mg | ORAL_CAPSULE | ORAL | Status: AC
  Administered 2021-10-05: 300 mg via ORAL
  Filled 2021-10-05: qty 1

## 2021-10-05 MED ORDER — MIDAZOLAM HCL 5 MG/5ML IJ SOLN
INTRAMUSCULAR | Status: DC | PRN
Start: 2021-10-05 — End: 2021-10-05
  Administered 2021-10-05: 2 mg via INTRAVENOUS

## 2021-10-05 MED ORDER — PROPOFOL 10 MG/ML IV BOLUS
INTRAVENOUS | Status: AC
  Filled 2021-10-05: qty 20

## 2021-10-05 MED ORDER — HYDROMORPHONE HCL 1 MG/ML IJ SOLN
INTRAMUSCULAR | Status: AC
  Administered 2021-10-05: 0.5 mg via INTRAVENOUS
  Filled 2021-10-05: qty 2

## 2021-10-05 MED ORDER — LIDOCAINE HCL (PF) 2 % IJ SOLN
INTRAMUSCULAR | Status: DC | PRN
  Administered 2021-10-05: 1.5 mg/kg/h via INTRADERMAL

## 2021-10-05 MED ORDER — DEXMEDETOMIDINE (PRECEDEX) IN NS 20 MCG/5ML (4 MCG/ML) IV SYRINGE
PREFILLED_SYRINGE | INTRAVENOUS | Status: DC | PRN
  Administered 2021-10-05 (×2): 8 ug via INTRAVENOUS

## 2021-10-05 MED ORDER — CHLORHEXIDINE GLUCONATE 0.12 % MT SOLN
15.0000 mL | Freq: Once | OROMUCOSAL | Status: AC
  Administered 2021-10-05: 15 mL via OROMUCOSAL

## 2021-10-05 MED ORDER — BUPIVACAINE HCL (PF) 0.25 % IJ SOLN
INTRAMUSCULAR | Status: DC | PRN
  Administered 2021-10-05: 30 mL

## 2021-10-05 MED ORDER — LIDOCAINE HCL 2 % IJ SOLN
INTRAMUSCULAR | Status: AC
  Filled 2021-10-05: qty 20

## 2021-10-05 MED ORDER — FENTANYL CITRATE (PF) 100 MCG/2ML IJ SOLN
INTRAMUSCULAR | Status: AC
  Filled 2021-10-05: qty 2

## 2021-10-05 MED ORDER — ROCURONIUM BROMIDE 10 MG/ML (PF) SYRINGE
PREFILLED_SYRINGE | INTRAVENOUS | Status: AC
  Filled 2021-10-05: qty 10

## 2021-10-05 MED ORDER — DEXAMETHASONE SODIUM PHOSPHATE 10 MG/ML IJ SOLN
INTRAMUSCULAR | Status: DC | PRN
  Administered 2021-10-05: 8 mg via INTRAVENOUS

## 2021-10-05 MED ORDER — ACETAMINOPHEN 160 MG/5ML PO SOLN: 325.0000 mg | ORAL | Status: DC | PRN

## 2021-10-05 MED ORDER — MEPERIDINE HCL 50 MG/ML IJ SOLN: 6.2500 mg | INTRAMUSCULAR | Status: DC | PRN

## 2021-10-05 MED ORDER — BUPIVACAINE LIPOSOME 1.3 % IJ SUSP
INTRAMUSCULAR | Status: DC | PRN
  Administered 2021-10-05: 20 mL

## 2021-10-05 MED ORDER — SUGAMMADEX SODIUM 500 MG/5ML IV SOLN
INTRAVENOUS | Status: DC | PRN
  Administered 2021-10-05: 300 mg via INTRAVENOUS

## 2021-10-05 MED ORDER — DEXMEDETOMIDINE (PRECEDEX) IN NS 20 MCG/5ML (4 MCG/ML) IV SYRINGE
PREFILLED_SYRINGE | INTRAVENOUS | Status: AC
  Filled 2021-10-05: qty 5

## 2021-10-05 MED ORDER — CHLORHEXIDINE GLUCONATE CLOTH 2 % EX PADS: 6.0000 | MEDICATED_PAD | Freq: Once | CUTANEOUS | Status: DC

## 2021-10-05 MED ORDER — LABETALOL HCL 5 MG/ML IV SOLN
INTRAVENOUS | Status: DC | PRN
  Administered 2021-10-05: 2.5 mg via INTRAVENOUS

## 2021-10-05 MED ORDER — BUPIVACAINE HCL (PF) 0.25 % IJ SOLN
INTRAMUSCULAR | Status: AC
  Filled 2021-10-05: qty 30

## 2021-10-05 MED ORDER — OXYCODONE HCL 5 MG PO TABS
ORAL_TABLET | ORAL | Status: DC
Start: 2021-10-05 — End: 2021-10-05
  Filled 2021-10-05: qty 1

## 2021-10-05 MED ORDER — OXYCODONE HCL 5 MG PO TABS
5.0000 mg | ORAL_TABLET | Freq: Once | ORAL | Status: AC | PRN
  Administered 2021-10-05: 5 mg via ORAL

## 2021-10-05 MED ORDER — CEFAZOLIN SODIUM-DEXTROSE 2-4 GM/100ML-% IV SOLN
2.0000 g | INTRAVENOUS | Status: AC
  Administered 2021-10-05: 2 g via INTRAVENOUS
  Filled 2021-10-05: qty 100

## 2021-10-05 MED ORDER — ONDANSETRON HCL 4 MG/2ML IJ SOLN
INTRAMUSCULAR | Status: DC | PRN
  Administered 2021-10-05: 4 mg via INTRAVENOUS

## 2021-10-05 MED ORDER — OXYCODONE-ACETAMINOPHEN 5-325 MG PO TABS: 1.0000 | ORAL_TABLET | ORAL | 0 refills | Status: DC | PRN

## 2021-10-05 MED ORDER — BUPIVACAINE LIPOSOME 1.3 % IJ SUSP
INTRAMUSCULAR | Status: AC
  Filled 2021-10-05: qty 20

## 2021-10-05 MED ORDER — ORAL CARE MOUTH RINSE: 15.0000 mL | Freq: Once | OROMUCOSAL | Status: AC

## 2021-10-05 MED ORDER — LIDOCAINE HCL (PF) 2 % IJ SOLN
INTRAMUSCULAR | Status: AC
  Filled 2021-10-05: qty 5

## 2021-10-05 MED ORDER — LIDOCAINE 2% (20 MG/ML) 5 ML SYRINGE
INTRAMUSCULAR | Status: DC | PRN
  Administered 2021-10-05: 100 mg via INTRAVENOUS

## 2021-10-05 MED ORDER — OXYCODONE HCL 5 MG/5ML PO SOLN: 5.0000 mg | Freq: Once | ORAL | Status: AC | PRN

## 2021-10-05 MED ORDER — PROPOFOL 10 MG/ML IV BOLUS
INTRAVENOUS | Status: DC | PRN
Start: 2021-10-05 — End: 2021-10-05
  Administered 2021-10-05: 180 mg via INTRAVENOUS

## 2021-10-05 MED ORDER — FENTANYL CITRATE (PF) 100 MCG/2ML IJ SOLN
INTRAMUSCULAR | Status: DC | PRN
  Administered 2021-10-05: 50 ug via INTRAVENOUS
  Administered 2021-10-05: 100 ug via INTRAVENOUS
  Administered 2021-10-05 (×2): 50 ug via INTRAVENOUS

## 2021-10-05 MED ORDER — ONDANSETRON HCL 4 MG/2ML IJ SOLN: 4.0000 mg | Freq: Once | INTRAMUSCULAR | Status: DC | PRN

## 2021-10-05 MED ORDER — BUPIVACAINE LIPOSOME 1.3 % IJ SUSP: 20.0000 mL | Freq: Once | INTRAMUSCULAR | Status: DC

## 2021-10-05 MED ORDER — CELECOXIB 200 MG PO CAPS
200.0000 mg | ORAL_CAPSULE | Freq: Once | ORAL | Status: AC
  Administered 2021-10-05: 200 mg via ORAL
  Filled 2021-10-05: qty 1

## 2021-10-05 MED ORDER — ROCURONIUM BROMIDE 10 MG/ML (PF) SYRINGE
PREFILLED_SYRINGE | INTRAVENOUS | Status: DC | PRN
  Administered 2021-10-05: 20 mg via INTRAVENOUS
  Administered 2021-10-05: 80 mg via INTRAVENOUS
  Administered 2021-10-05: 20 mg via INTRAVENOUS

## 2021-10-05 MED ORDER — FENTANYL CITRATE PF 50 MCG/ML IJ SOSY
PREFILLED_SYRINGE | INTRAMUSCULAR | Status: AC
  Filled 2021-10-05: qty 3

## 2021-10-05 MED ORDER — GLYCOPYRROLATE 0.2 MG/ML IJ SOLN
INTRAMUSCULAR | Status: DC | PRN
  Administered 2021-10-05: .2 mg via INTRAVENOUS

## 2021-10-05 MED ORDER — DEXAMETHASONE SODIUM PHOSPHATE 10 MG/ML IJ SOLN
INTRAMUSCULAR | Status: AC
  Filled 2021-10-05: qty 1

## 2021-10-05 MED ORDER — MIDAZOLAM HCL 2 MG/2ML IJ SOLN
INTRAMUSCULAR | Status: AC
  Filled 2021-10-05: qty 2

## 2021-10-05 MED ORDER — LACTATED RINGERS IV SOLN: INTRAVENOUS | Status: DC

## 2021-10-05 SURGICAL SUPPLY — 50 items
BAG COUNTER SPONGE SURGICOUNT (BAG) ×2 IMPLANT
CABLE HIGH FREQUENCY MONO STRZ (ELECTRODE) ×2 IMPLANT
CHLORAPREP W/TINT 26 (MISCELLANEOUS) ×2 IMPLANT
COTTONBALL LRG STERILE PKG (GAUZE/BANDAGES/DRESSINGS) ×2 IMPLANT
COVER SURGICAL LIGHT HANDLE (MISCELLANEOUS) ×2 IMPLANT
DERMABOND ADVANCED (GAUZE/BANDAGES/DRESSINGS) ×1
DERMABOND ADVANCED .7 DNX12 (GAUZE/BANDAGES/DRESSINGS) ×1 IMPLANT
DRAPE LAPAROTOMY T 98X78 PEDS (DRAPES) ×2 IMPLANT
DRSG TEGADERM 4X4.75 (GAUZE/BANDAGES/DRESSINGS) ×2 IMPLANT
ELECT REM PT RETURN 15FT ADLT (MISCELLANEOUS) ×2 IMPLANT
GLOVE BIOGEL PI IND STRL 8 (GLOVE) ×1 IMPLANT
GLOVE BIOGEL PI INDICATOR 8 (GLOVE) ×1
GLOVE INDICATOR 8.0 STRL GRN (GLOVE) ×2 IMPLANT
GLOVE SURG LX 7.5 STRW (GLOVE) ×1
GLOVE SURG LX STRL 7.5 STRW (GLOVE) ×1 IMPLANT
GOWN STRL REUS W/ TWL XL LVL3 (GOWN DISPOSABLE) ×2 IMPLANT
GOWN STRL REUS W/TWL XL LVL3 (GOWN DISPOSABLE) ×2
GRASPER SUT TROCAR 14GX15 (MISCELLANEOUS) ×2 IMPLANT
IRRIG SUCT STRYKERFLOW 2 WTIP (MISCELLANEOUS)
IRRIGATION SUCT STRKRFLW 2 WTP (MISCELLANEOUS) IMPLANT
KIT BASIN OR (CUSTOM PROCEDURE TRAY) ×2 IMPLANT
KIT TURNOVER KIT A (KITS) IMPLANT
MARKER SKIN DUAL TIP RULER LAB (MISCELLANEOUS) ×2 IMPLANT
MESH 3DMAX 5X7 RT XLRG (Mesh General) ×1 IMPLANT
NDL INSUFFLATION 14GA 120MM (NEEDLE) ×2 IMPLANT
NEEDLE HYPO 22GX1.5 SAFETY (NEEDLE) ×2 IMPLANT
NEEDLE INSUFFLATION 14GA 120MM (NEEDLE) ×4 IMPLANT
NS IRRIG 1000ML POUR BTL (IV SOLUTION) ×2 IMPLANT
PACK GENERAL/GYN (CUSTOM PROCEDURE TRAY) ×2 IMPLANT
RELOAD STAPLE 4.0 BLU F/HERNIA (INSTRUMENTS) IMPLANT
RELOAD STAPLE 4.8 BLK F/HERNIA (STAPLE) ×1 IMPLANT
RELOAD STAPLE HERNIA 4.0 BLUE (INSTRUMENTS) ×2 IMPLANT
RELOAD STAPLE HERNIA 4.8 BLK (STAPLE) ×2 IMPLANT
SCISSORS LAP 5X35 DISP (ENDOMECHANICALS) ×2 IMPLANT
SET TUBE SMOKE EVAC HIGH FLOW (TUBING) ×2 IMPLANT
SPIKE FLUID TRANSFER (MISCELLANEOUS) ×2 IMPLANT
STAPLER HERNIA 12 8.5 360D (INSTRUMENTS) ×2 IMPLANT
SUT MNCRL AB 4-0 PS2 18 (SUTURE) ×2 IMPLANT
SUT NOVA NAB GS-21 0 18 T12 DT (SUTURE) IMPLANT
SUT PDS AB 2-0 CT1 27 (SUTURE) IMPLANT
SUT VIC AB 3-0 SH 8-18 (SUTURE) ×2 IMPLANT
SUT VICRYL 0 UR6 27IN ABS (SUTURE) IMPLANT
SYR CONTROL 10ML LL (SYRINGE) ×2 IMPLANT
TOWEL OR 17X26 10 PK STRL BLUE (TOWEL DISPOSABLE) ×2 IMPLANT
TRAY FOL W/BAG SLVR 16FR STRL (SET/KITS/TRAYS/PACK) ×1 IMPLANT
TRAY FOLEY MTR SLVR 16FR STAT (SET/KITS/TRAYS/PACK) IMPLANT
TRAY FOLEY W/BAG SLVR 16FR LF (SET/KITS/TRAYS/PACK) ×1
TRAY LAPAROSCOPIC (CUSTOM PROCEDURE TRAY) ×2 IMPLANT
TROCAR BLADELESS OPT 5 100 (ENDOMECHANICALS) ×4 IMPLANT
TROCAR KII 12X100 BLADELESS (ENDOMECHANICALS) ×2 IMPLANT

## 2021-10-05 NOTE — Op Note (Signed)
? ?Patient: Aaron Lozano (July 12, 1970, [id number]) ? ?Date of Surgery: 10/05/2021  ? ?Preoperative Diagnosis: RIGHT INGUINAL HERNIA, UMBILICAL HERNIA  ? ?Postoperative Diagnosis: RIGHT INGUINAL HERNIA, UMBILICAL HERNIA  ? ?Surgical Procedure: LAPAROSCOPIC RIGHT INGUINAL HERNIA REPAIR WITH MESH: [id number] ?UMBILICAL HERNIA REPAIR WITH MESH:   6.1YW x 7.3XT umbilical hernia ? ?Operative Team Members:  ?Surgeon(s) and Role: ?   * Tavoris Brisk, Nickola Major, MD - Primary  ? ?Anesthesiologist: Janeece Riggers, MD ?CRNA: Montel Clock, CRNA; West Pugh, CRNA  ? ?Anesthesia: General  ? ?Fluids:  ?No intake/output data recorded. ? ?Complications: None ? ?Drains:  None ? ?Specimen: None ? ?Disposition:  PACU - hemodynamically stable. ? ?Plan of Care: Discharge to home after PACU ? ?Indications for Procedure: ?Aaron Lozano is a 51 year old male with a right inguinal and umbilical hernia. ? ?I recommended laparoscopic right inguinal hernia repair with mesh and primary open umbilical hernia repair.  He elected not to undergo abdominoplasty at the time of hernia repair.  I discussed my portion of the procedure as well as its risk, benefits, and alternatives with the patient. After full discussion all questions answered the patient granted consent to proceed.  ? ? ? ?Findings:  ?Technique: Transabdominal preperitoneal (TAPP) ?Hernia Location: right indirect inguinal hernia ?Mesh Size &Type:  BARD 3D max extra-large right sided mesh ?Mesh Fixation: Endo-Universal hernia stapler ? ? ?0.6YI x 0.5 cm umbilical hernia fixed with two simple 0-vicryl stitches ? ? ?Infection status: ?Patient: Private Patient Elective Case ?Case: Elective ?Infection Present At Time Of Surgery (PATOS): None ? ? ?Description of Procedure: ? ?The patient was positioned supine, padded and secured to the bed, with both arms tucked.  The abdomen was widely prepped and draped.  A time out procedure was performed.  A 1 cm infraumbilical incision was made.  The abdomen was  entered without trauma to the underlying viscera.  The abdomen was insufflated to 15 mm of Hg.  A 12 mm trocar was inserted at the periumbilical incision.  Additional 5 mm trocars were placed in the left and right abdomen.  There was no trauma to the underlying viscera. ? ?There was an indirect hernia on the RIGHT.  Utilizing a transabdominal pre peritoneal technique (TAPP), a horizontal incision was made in the peritoneum, immediately below the umbilicus.  Dissection was carried out in the pre peritoneal space down to the level of the hernia sac which was reduced into the peritoneal cavity completely.  The cord contents were parietalized and preserved.  A large pre peritoneal dissection was performed to uncover the direct, indirect, femoral and obturator spaces.  Cooper?s ligament was uncovered medially and the psoas muscle uncovered laterally. ? ?The mesh, as documented above, was opened and advanced into the pre peritoneal position so that it more than adequately covered the indirect, direct, femoral and obturator spaces.  The mesh laid flat, with no inferior folds and covered the entire myopectineal orifice.  The mesh was fixated with the endo-universal hernia stapler to Cooper?s ligament and the posterior aspect of the rectus muscle.  The peritoneal flap was closed with the same device.  There were no peritoneal defects or exposed mesh at the conclusion. ? ?The umbilical trocar was removed and the fascial defect was closed with a 0 Vicryl suture.  Two sutures were used to close the umbilical hernia defect.  The peritoneal cavity was completely desufflated, the trocars removed and the skin closed with 4-0 Monocryl subcuticular suture and skin glue.  All sponge and  needle counts were correct at the end of the case. ? ?Louanna Raw, MD ?General, Bariatric, & Minimally Invasive Surgery ?[facility], [place] ? ?

## 2021-10-05 NOTE — Anesthesia Procedure Notes (Signed)
Procedure Name: Intubation ?Date/Time: 10/05/2021 7:36 AM ?Performed by: West Pugh, CRNA ?Pre-anesthesia Checklist: Patient identified, Emergency Drugs available, Suction available, Patient being monitored and Timeout performed ?Patient Re-evaluated:Patient Re-evaluated prior to induction ?Oxygen Delivery Method: Circle system utilized ?Preoxygenation: Pre-oxygenation with 100% oxygen ?Induction Type: IV induction ?Ventilation: Mask ventilation without difficulty and Oral airway inserted - appropriate to patient size ?Laryngoscope Size: Mac and 4 ?Grade View: Grade II ?Tube type: Oral ?Tube size: 7.5 mm ?Number of attempts: 1 ?Airway Equipment and Method: Stylet ?Placement Confirmation: ETT inserted through vocal cords under direct vision, positive ETCO2, CO2 detector and breath sounds checked- equal and bilateral ?Secured at: 22 cm ?Tube secured with: Tape ?Dental Injury: Teeth and Oropharynx as per pre-operative assessment  ? ? ? ? ?

## 2021-10-05 NOTE — Discharge Instructions (Signed)
 GROIN HERNIA REPAIR POST OPERATIVE INSTRUCTIONS  Thinking Clearly  The anesthesia may cause you to feel different for 1 or 2 days. Do not drive, drink alcohol, or make any big decisions for at least 2 days.  Nutrition When you wake up, you will be able to drink small amounts of liquid. If you do not feel sick, you can slowly advance your diet to regular foods. Continue to drink lots of fluids, usually about 8 to 10 glasses per day. Eat a high-fiber diet so you don't strain during bowel movements. High-Fiber Foods Foods high in fiber include beans, bran cereals and whole-grain breads, peas, dried fruit (figs, apricots, and dates), raspberries, blackberries, strawberries, sweet corn, broccoli, baked potatoes with skin, plums, pears, apples, greens, and nuts. Activity Slowly increase your activity. Be sure to get up and walk every hour or so to prevent blood clots. No heavy lifting or strenuous activity for 4 weeks following surgery to prevent hernias at your incision sites or recurrence of your hernia. It is normal to feel tired. You may need more sleep than usual.  Get your rest but make sure to get up and move around frequently to prevent blood clots and pneumonia.  Work and Return to School You can go back to work when you feel well enough. Discuss the timing with your surgeon. You can usually go back to school or work 1 week or less after an laparoscopic or an open repair. If your work requires heavy lifting or strenuous activity you need to be placed on light duty for 4 weeks following surgery. You can return to gym class, sports or other physical activities 4 weeks after surgery.  Wound Care You may experience significant bruising in the groin including into the scrotum in males.  Rest, elevating the groin and scrotum above the level of the heart, ice and compression with tight fitting underwear can help.  Always wash your hands before and after touching near your incision site. Do  not soak in a bathtub until cleared at your follow up appointment. You may take a shower 24 hours after surgery. A small amount of drainage from the incision is normal. If the drainage is thick and yellow or the site is red, you may have an infection, so call your surgeon. If you have a drain in one of your incisions, it will be taken out in office when the drainage stops. Steri-Strips will fall off in 7 to 10 days or they will be removed during your first office visit. If you have dermabond glue covering over the incision, allow the glue to flake off on its own. Protect the new skin, especially from the sun. The sun can burn and cause darker scarring. Your scar will heal in about 4 to 6 weeks and will become softer and continue to fade over the next year.  The cosmetic appearance of the incisions will improve over the course of the first year after surgery. Sensation around your incision will return in a few weeks or months.  Bowel Movements After intestinal surgery, you may have loose watery stools for several days. If watery diarrhea lasts longer than 3 days, contact your surgeon. Pain medication (narcotics) can cause constipation. Increase the fiber in your diet with high-fiber foods if you are constipated. You can take an over the counter stool softener like Colace to avoid constipation.  Additional over the counter medications can also be used if Colace isn't sufficient (for example, Milk of Magnesia or Miralax).    Pain The amount of pain is different for each person. Some people need only 1 to 3 doses of pain control medication, while others need more. Take alternating doses of tylenol and ibuprofen around the clock for the first five days following surgery.  This will provide a baseline of pain control and help with inflammation.  Take the narcotic pain medication in addition if needed for severe pain.  Contact Your Surgeon at [phone], if you have: Pain that will not go away Pain that  gets worse A fever of more than 101F (38.3C) Repeated vomiting Swelling, redness, bleeding, or bad-smelling drainage from your wound site Strong abdominal pain No bowel movement or unable to pass gas for 3 days Watery diarrhea lasting longer than 3 days  Pain Control The goal of pain control is to minimize pain, keep you moving and help you heal. Your surgical team will work with you on your pain plan. Most often a combination of therapies and medications are used to control your pain. You may also be given medication (local anesthetic) at the surgical site. This may help control your pain for several days. Extreme pain puts extra stress on your body at a time when your body needs to focus on healing. Do not wait until your pain has reached a level "10" or is unbearable before telling your doctor or nurse. It is much easier to control pain before it becomes severe. Following a laparoscopic procedure, pain is sometimes felt in the shoulder. This is due to the gas inserted into your abdomen during the procedure. Moving and walking helps to decrease the gas and the right shoulder pain.  Use the guide below for ways to manage your post-operative pain. Learn more by going to facs.org/safepaincontrol.  How Intense Is My Pain Common Therapies to Feel Better       I hardly notice my pain, and it does not interfere with my activities.  I notice my pain and it distracts me, but I can still do activities (sitting up, walking, standing).  Non-Medication Therapies  Ice (in a bag, applied over clothing at the surgical site), elevation, rest, meditation, massage, distraction (music, TV, play) walking and mild exercise Splinting the abdomen with pillows +  Non-Opioid Medications Acetaminophen (Tylenol) Non-steroidal anti-inflammatory drugs (NSAIDS) Aspirin, Ibuprofen (Motrin, Advil) Naproxen (Aleve) Take these as needed, when you feel pain. Both acetaminophen and NSAIDs help to decrease pain  and swelling (inflammation).      My pain is hard to ignore and is more noticeable even when I rest.  My pain interferes with my usual activities.  Non-Medication Therapies  +  Non-Opioid medications  Take on a regular schedule (around-the-clock) instead of as needed. (For example, Tylenol every 6 hours at 9:00 am, 3:00 pm, 9:00 pm, 3:00 am and Motrin every 6 hours at 12:00 am, 6:00 am, 12:00 pm, 6:00 pm)         I am focused on my pain, and I am not doing my daily activities.  I am groaning in pain, and I cannot sleep. I am unable to do anything.  My pain is as bad as it could be, and nothing else matters.  Non-Medication Therapies  +  Around-the-Clock Non-Opioid Medications  +  Short-acting opioids  Opioids should be used with other medications to manage severe pain. Opioids block pain and give a feeling of euphoria (feel high). Addiction, a serious side effect of opioids, is rare with short-term (a few days) use.  Examples of short-acting opioids   include: Tramadol (Ultram), Hydrocodone (Norco, Vicodin), Hydromorphone (Dilaudid), Oxycodone (Oxycontin)     The above directions have been adapted from the American College of [employer]geons Surgical Patient Education Program.  Please refer to the ACS website if needed: [email or web address]://[email or web address]/-/media/files/[email or web address]   Jazzlynn Rawe, MD [facility] [address], [address], [place], [place]  [address] ?  [address] [address], [place], [place]   [address] [phone] ? [phone] ? FAX [phone] Web site: [email or web address]  

## 2021-10-05 NOTE — Anesthesia Postprocedure Evaluation (Signed)
Anesthesia Post Note ? ?Patient: Aaron Lozano ? ?Procedure(s) Performed: LAPAROSCOPIC RIGHT INGUINAL HERNIA REPAIR WITH MESH ?UMBILICAL HERNIA REPAIR WITH MESH ? ?  ? ?Patient location during evaluation: PACU ?Anesthesia Type: General ?Level of consciousness: awake and alert ?Pain management: pain level controlled ?Vital Signs Assessment: post-procedure vital signs reviewed and stable ?Respiratory status: spontaneous breathing, nonlabored ventilation, respiratory function stable and patient connected to nasal cannula oxygen ?Cardiovascular status: blood pressure returned to baseline and stable ?Postop Assessment: no apparent nausea or vomiting ?Anesthetic complications: no ? ? ?No notable events documented. ? ?Last Vitals:  ?Vitals:  ? 10/05/21 0900 10/05/21 0915  ?BP: 133/83 (!) 138/96  ?Pulse: 69 64  ?Resp: (!) 9 (!) 8  ?Temp: (!) 36.4 ?C   ?SpO2: 100% 100%  ?  ?Last Pain:  ?Vitals:  ? 10/05/21 0915  ?TempSrc:   ?PainSc: 8   ? ? ?  ?  ?  ?  ?  ?  ? ?Catheryne Deford ? ? ? ? ?

## 2021-10-05 NOTE — Transfer of Care (Signed)
Immediate Anesthesia Transfer of Care Note ? ?Patient: Aaron Lozano ? ?Procedure(s) Performed: LAPAROSCOPIC RIGHT INGUINAL HERNIA REPAIR WITH MESH ?UMBILICAL HERNIA REPAIR WITH MESH ? ?Patient Location: PACU ? ?Anesthesia Type:General ? ?Level of Consciousness: drowsy ? ?Airway & Oxygen Therapy: Patient Spontanous Breathing and Patient connected to face mask oxygen ? ?Post-op Assessment: Report given to RN and Post -op Vital signs reviewed and stable ? ?Post vital signs: Reviewed and stable ? ?Last Vitals:  ?Vitals Value Taken Time  ?BP 133/83 10/05/21 0901  ?Temp    ?Pulse 58 10/05/21 0903  ?Resp 9 10/05/21 0903  ?SpO2 100 % 10/05/21 0903  ?Vitals shown include unvalidated device data. ? ?Last Pain:  ?Vitals:  ? 10/05/21 0609  ?TempSrc:   ?PainSc: 3   ?   ? ?Patients Stated Pain Goal: 2 (10/05/21 8184) ? ?Complications: No notable events documented. ?

## 2021-10-05 NOTE — H&P (Signed)
? ?Admitting Physician: Aaron Lozano ? ?Service: General surgery ? ?CC: Hernia ? ?Subjective  ? ?HPI: ?Aaron Lozano is an 51 y.o. male who is here for umbilical and right inguinal hernia repair. ? ?Past Medical History:  ?Diagnosis Date  ? Arthritis   ? Bipolar 1 disorder, depressed ([place])   ? ED (erectile dysfunction)   ? Hypertension   ? 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 Inguinal hernia   ? Right  ? Pneumonia   ? x2  ? Umbilical hernia   ? ? ?Past Surgical History:  ?Procedure Laterality Date  ? ANKLE SURGERY    ? BACK SURGERY    ? x2  ? EXCISION METACARPAL MASS Right 08/10/2021  ? Procedure: RIGHT INDEX FINGER MASS EXCISION;  Surgeon: Aaron Jakob, MD;  Location: [facility];  Service: Orthopedics;  Laterality: Right;  PRE-OP BLOCK ?LENGHT OF SURGERY: 60 MINUTES  ? GASTRIC BYPASS    ? INGUINAL HERNIA REPAIR Left 1988  ? INGUINAL HERNIA REPAIR  2006  ? right bicep reattached    ? SHOULDER SURGERY    ? x2  ? ? ?Family History  ?Problem Relation Age of Onset  ? Stroke Mother   ? Heart attack Father   ? Colon cancer Maternal Grandmother   ? Lung cancer Neg Hx   ? Prostate cancer Neg Hx   ? ? ?Social:  reports that he has never smoked. He has never used smokeless tobacco. He reports that he does not currently use alcohol. He reports that he does not use drugs. ? ?Allergies: No Known Allergies ? ?Medications: ?Current Outpatient Medications  ?Medication Instructions  ? acetaminophen (TYLENOL) 650 mg, Oral, Every 6 hours  ? buPROPion (WELLBUTRIN XL) 150 mg, Oral, Daily  ? cetirizine (ZYRTEC) 10 mg, Oral, Daily  ? Glycerin-Hypromellose-PEG 400 (DRY EYE RELIEF DROPS OP) 1 drop, Both Eyes, Daily PRN  ? ibuprofen (ADVIL) 600 mg, Oral, Every 6 hours  ? sildenafil (REVATIO) 20 MG tablet Take 5 tablets by mouth every other day as needed.  ? ? ?ROS - all of the below systems have been reviewed with the patient and positives are indicated with bold text ?General: chills, fever or night  sweats ?Eyes: blurry vision or double vision ?ENT: epistaxis or sore throat ?Allergy/Immunology: itchy/watery eyes or nasal congestion ?Hematologic/Lymphatic: bleeding problems, blood clots or swollen lymph nodes ?Endocrine: temperature intolerance or unexpected weight changes ?Breast: new or changing breast lumps or nipple discharge ?Resp: cough, shortness of breath, or wheezing ?CV: chest pain or dyspnea on exertion ?GI: as per HPI ?GU: dysuria, trouble voiding, or hematuria ?MSK: joint pain or joint stiffness ?Neuro: TIA or stroke symptoms ?Derm: pruritus and skin lesion changes ?Psych: anxiety and depression ? ?Objective  ? ?PE ?Blood pressure (!) 143/95, pulse (!) 56, temperature 98.4 ?F (36.9 ?C), temperature source Oral, resp. rate 18, height '5\' 9"'$  (1.753 m), weight 96.6 kg, SpO2 99 %. ?Constitutional: NAD; conversant; no deformities ?Eyes: Moist conjunctiva; no lid lag; anicteric; PERRL ?Neck: Trachea midline; no thyromegaly ?Lungs: Normal respiratory effort; no tactile fremitus ?CV: RRR; no palpable thrills; no pitting edema ?GI: Abd right inguinal hernia, umbilical hernia; no palpable hepatosplenomegaly ?MSK: Normal range of motion of extremities; no clubbing/cyanosis ?Psychiatric: Appropriate affect; alert and oriented x3 ?Lymphatic: No palpable cervical or axillary lymphadenopathy ? ?Results for orders placed or performed during the hospital encounter of 10/05/21 (from the past 24 hour(s))  ?CBC     Status: Abnormal  ? Collection  Time: 10/05/21  6:20 AM  ?Result Value Ref Range  ? WBC 3.8 (L) 4.0 - 10.5 K/uL  ? RBC 5.00 4.22 - 5.81 MIL/uL  ? Hemoglobin 14.6 13.0 - 17.0 g/dL  ? HCT 42.3 39.0 - 52.0 %  ? MCV 84.6 80.0 - 100.0 fL  ? MCH 29.2 26.0 - 34.0 pg  ? MCHC 34.5 30.0 - 36.0 g/dL  ? RDW 12.8 11.5 - 15.5 %  ? Platelets 178 150 - 400 K/uL  ? nRBC 0.0 0.0 - 0.2 %  ?Basic metabolic panel     Status: Abnormal  ? Collection Time: 10/05/21  6:20 AM  ?Result Value Ref Range  ? Sodium 137 135 - 145 mmol/L  ?  Potassium 3.6 3.5 - 5.1 mmol/L  ? Chloride 107 98 - 111 mmol/L  ? CO2 24 22 - 32 mmol/L  ? Glucose, Bld 91 70 - 99 mg/dL  ? BUN 18 6 - 20 mg/dL  ? Creatinine, Ser 0.98 0.61 - 1.24 mg/dL  ? Calcium 8.6 (L) 8.9 - 10.3 mg/dL  ? GFR, Estimated >60 >60 mL/min  ? Anion gap 6 5 - 15  ? ? ?Imaging Orders  ?No imaging studies ordered today  ? ? ? ?Assessment and Plan  ? ?Aaron Lozano is a 51 year old male with a right inguinal and umbilical hernia. ? ?I recommended laparoscopic right inguinal hernia repair with mesh and primary open umbilical hernia repair.  He elected not to undergo abdominoplasty at the time of hernia repair.  I discussed my portion of the procedure as well as its risk, benefits, and alternatives with the patient. After full discussion all questions answered the patient granted consent to proceed.  ? ? ?Felicie Morn, MD ? ?[facility] ?Use [email or web address] to contact on call provider ? ? ? ?

## 2021-10-06 ENCOUNTER — Encounter (HOSPITAL_COMMUNITY): Payer: Self-pay | Admitting: Surgery

## 2021-10-13 ENCOUNTER — Other Ambulatory Visit: Payer: Self-pay | Admitting: Nurse Practitioner

## 2021-10-13 DIAGNOSIS — N529 Male erectile dysfunction, unspecified: Secondary | ICD-10-CM

## 2021-11-09 ENCOUNTER — Other Ambulatory Visit: Payer: Self-pay | Admitting: Nurse Practitioner

## 2021-11-09 DIAGNOSIS — N529 Male erectile dysfunction, unspecified: Secondary | ICD-10-CM

## 2021-11-19 ENCOUNTER — Ambulatory Visit: Payer: BC Managed Care – PPO | Admitting: Nurse Practitioner

## 2021-12-06 ENCOUNTER — Other Ambulatory Visit: Payer: Self-pay | Admitting: Nurse Practitioner

## 2021-12-06 DIAGNOSIS — F319 Bipolar disorder, unspecified: Secondary | ICD-10-CM

## 2021-12-13 ENCOUNTER — Other Ambulatory Visit: Payer: Self-pay | Admitting: Nurse Practitioner

## 2021-12-13 DIAGNOSIS — N529 Male erectile dysfunction, unspecified: Secondary | ICD-10-CM

## 2021-12-15 ENCOUNTER — Other Ambulatory Visit: Payer: Self-pay | Admitting: Nurse Practitioner

## 2021-12-15 DIAGNOSIS — N529 Male erectile dysfunction, unspecified: Secondary | ICD-10-CM

## 2021-12-15 MED ORDER — SILDENAFIL CITRATE 20 MG PO TABS: ORAL_TABLET | ORAL | 0 refills | Status: DC

## 2022-01-12 ENCOUNTER — Other Ambulatory Visit: Payer: Self-pay | Admitting: Nurse Practitioner

## 2022-01-12 DIAGNOSIS — N529 Male erectile dysfunction, unspecified: Secondary | ICD-10-CM

## 2022-02-09 ENCOUNTER — Other Ambulatory Visit: Payer: Self-pay | Admitting: Nurse Practitioner

## 2022-02-09 DIAGNOSIS — N529 Male erectile dysfunction, unspecified: Secondary | ICD-10-CM

## 2022-03-06 ENCOUNTER — Other Ambulatory Visit: Payer: Self-pay | Admitting: Nurse Practitioner

## 2022-03-06 DIAGNOSIS — F319 Bipolar disorder, unspecified: Secondary | ICD-10-CM

## 2022-03-11 ENCOUNTER — Other Ambulatory Visit: Payer: Self-pay | Admitting: Nurse Practitioner

## 2022-03-11 DIAGNOSIS — N529 Male erectile dysfunction, unspecified: Secondary | ICD-10-CM

## 2022-06-07 ENCOUNTER — Emergency Department (HOSPITAL_BASED_OUTPATIENT_CLINIC_OR_DEPARTMENT_OTHER): Payer: Medicaid Other

## 2022-06-07 ENCOUNTER — Encounter (HOSPITAL_BASED_OUTPATIENT_CLINIC_OR_DEPARTMENT_OTHER): Payer: Self-pay | Admitting: Emergency Medicine

## 2022-06-07 ENCOUNTER — Other Ambulatory Visit: Payer: Self-pay

## 2022-06-07 ENCOUNTER — Emergency Department (HOSPITAL_BASED_OUTPATIENT_CLINIC_OR_DEPARTMENT_OTHER)
Admission: EM | Admit: 2022-06-07 | Discharge: 2022-06-07 | Disposition: A | Discharge status: Home / Self Care | Payer: Medicaid Other | Attending: Emergency Medicine | Admitting: Emergency Medicine

## 2022-06-07 DIAGNOSIS — R1084 Generalized abdominal pain: Secondary | ICD-10-CM | POA: Insufficient documentation

## 2022-06-07 DIAGNOSIS — R197 Diarrhea, unspecified: Secondary | ICD-10-CM | POA: Diagnosis not present

## 2022-06-07 DIAGNOSIS — I10 Essential (primary) hypertension: Secondary | ICD-10-CM | POA: Diagnosis not present

## 2022-06-07 DIAGNOSIS — R112 Nausea with vomiting, unspecified: Secondary | ICD-10-CM | POA: Diagnosis not present

## 2022-06-07 DIAGNOSIS — Z79899 Other long term (current) drug therapy: Secondary | ICD-10-CM | POA: Diagnosis not present

## 2022-06-07 DIAGNOSIS — I700 Atherosclerosis of aorta: Secondary | ICD-10-CM | POA: Diagnosis not present

## 2022-06-07 DIAGNOSIS — R109 Unspecified abdominal pain: Secondary | ICD-10-CM | POA: Diagnosis not present

## 2022-06-07 LAB — COMPREHENSIVE METABOLIC PANEL
ALT: 24 U/L (ref 0–44)
AST: 24 U/L (ref 15–41)
Albumin: 4.3 g/dL (ref 3.5–5.0)
Alkaline Phosphatase: 49 U/L (ref 38–126)
Anion gap: 9 (ref 5–15)
BUN: 14 mg/dL (ref 6–20)
CO2: 23 mmol/L (ref 22–32)
Calcium: 9.1 mg/dL (ref 8.9–10.3)
Chloride: 107 mmol/L (ref 98–111)
Creatinine, Ser: 1.04 mg/dL (ref 0.61–1.24)
GFR, Estimated: >60 mL/min (ref >60)
Glucose, Bld: 91 mg/dL (ref 70–99)
Potassium: 4.3 mmol/L (ref 3.5–5.1)
Sodium: 139 mmol/L (ref 135–145)
Total Bilirubin: 1.8 mg/dL — ABNORMAL HIGH (ref 0.3–1.2)
Total Protein: 6.1 g/dL — ABNORMAL LOW (ref 6.5–8.1)

## 2022-06-07 LAB — CBC
HCT: 44.7 % (ref 39.0–52.0)
Hemoglobin: 15.3 g/dL (ref 13.0–17.0)
MCH: 29.9 pg (ref 26.0–34.0)
MCHC: 34.2 g/dL (ref 30.0–36.0)
MCV: 87.5 fL (ref 80.0–100.0)
Platelets: 213 10*3/uL (ref 150–400)
RBC: 5.11 MIL/uL (ref 4.22–5.81)
RDW: 12.6 % (ref 11.5–15.5)
WBC: 5.2 10*3/uL (ref 4.0–10.5)
nRBC: 0 % (ref 0.0–0.2)

## 2022-06-07 LAB — LIPASE, BLOOD: Lipase: 13 U/L (ref 11–51)

## 2022-06-07 MED ORDER — ONDANSETRON 4 MG PO TBDP: 4.0000 mg | ORAL_TABLET | Freq: Three times a day (TID) | ORAL | 1 refills | Status: DC | PRN

## 2022-06-07 MED ORDER — DICYCLOMINE HCL 20 MG PO TABS: 20.0000 mg | ORAL_TABLET | Freq: Two times a day (BID) | ORAL | 0 refills | Status: DC

## 2022-06-07 MED ORDER — ONDANSETRON HCL 4 MG/2ML IJ SOLN
4.0000 mg | Freq: Once | INTRAMUSCULAR | Status: AC
  Administered 2022-06-07: 4 mg via INTRAVENOUS
  Filled 2022-06-07: qty 2

## 2022-06-07 MED ORDER — IOHEXOL 300 MG/ML  SOLN
100.0000 mL | Freq: Once | INTRAMUSCULAR | Status: AC | PRN
  Administered 2022-06-07: 80 mL via INTRAVENOUS

## 2022-06-07 MED ORDER — MORPHINE SULFATE (PF) 4 MG/ML IV SOLN
4.0000 mg | Freq: Once | INTRAVENOUS | Status: AC
  Administered 2022-06-07: 4 mg via INTRAVENOUS
  Filled 2022-06-07: qty 1

## 2022-06-07 MED ORDER — HYDROMORPHONE HCL 1 MG/ML IJ SOLN
0.5000 mg | Freq: Once | INTRAMUSCULAR | Status: AC
  Administered 2022-06-07: 0.5 mg via INTRAVENOUS
  Filled 2022-06-07: qty 1

## 2022-06-07 NOTE — ED Triage Notes (Signed)
Pt via pov from home with n/v/d x 1 month intermittently. Pt denies any hx of the same; did not seek medical care due to lack of insurance. Pt reports that it doesn't matter what he eats for drinks - sometimes it makes him sick, sometimes it doesn't. Pt endorses generalized abdominal pain. Pt alert & oriented, nad noted.

## 2022-06-07 NOTE — ED Provider Notes (Signed)
[facility] EMERGENCY DEPT Provider Note   CSN: [id number] Arrival date & time: 06/07/22  1130     History  Chief Complaint  Patient presents with   Nausea   Emesis   Diarrhea    Aaron Lozano is a 52 y.o. male with a past medical history of hypertension, IBS and gastric bypass presenting today with multiple months worth of generalized abdominal pain, nausea, vomiting or diarrhea.  He reports that there are no specific food triggers.  Also states his diarrhea is tan which is not characteristic of his IBS bowel movements.   Emesis Associated symptoms: diarrhea   Diarrhea Associated symptoms: vomiting        Home Medications Prior to Admission medications   Medication Sig Start Date End Date Taking? Authorizing Provider  ondansetron (ZOFRAN-ODT) 4 MG disintegrating tablet Take 1 tablet (4 mg total) by mouth every 8 (eight) hours as needed for nausea or vomiting. 06/07/22  Yes Anaya Bovee A, PA-C  acetaminophen (TYLENOL) 325 MG tablet Take 2 tablets (650 mg total) by mouth every 6 (six) hours. Patient taking differently: Take 650 mg by mouth every 6 (six) hours as needed for headache. 08/10/21   Milly Jakob, MD  buPROPion (WELLBUTRIN XL) 150 MG 24 hr tablet TAKE 1 TABLET BY MOUTH EVERY DAY 12/07/21   Paseda, Dewaine Conger, FNP  cetirizine (ZYRTEC) 10 MG tablet Take 10 mg by mouth daily.    [provider]  Glycerin-Hypromellose-PEG 400 (DRY EYE RELIEF DROPS OP) Place 1 drop into both eyes daily as needed (Dry eye).    [provider]  oxyCODONE-acetaminophen (PERCOCET) 5-325 MG tablet Take 1 tablet by mouth every 4 (four) hours as needed for severe pain. 10/05/21 10/05/22  Stechschulte, Nickola Major, MD  sildenafil (REVATIO) 20 MG tablet TAKE 5 TABLETS BY MOUTH EVERY OTHER DAY AS NEEDED 02/09/22   Renee Rival, FNP      Allergies    Patient has no known allergies.    Review of Systems   Review of Systems  Gastrointestinal:  Positive for  diarrhea and vomiting.    Physical Exam Updated Vital Signs BP (!) 145/99 (BP Location: Right Arm)   Pulse 74   Temp 97.8 F (36.6 C) (Oral)   Resp 18   Ht '5\' 9"'$  (1.753 m)   Wt 93 kg   SpO2 99%   BMI 30.27 kg/m  Physical Exam Vitals and nursing note reviewed.  Constitutional:      Appearance: Normal appearance.  HENT:     Head: Normocephalic and atraumatic.  Eyes:     General: No scleral icterus.    Conjunctiva/sclera: Conjunctivae normal.  Pulmonary:     Effort: Pulmonary effort is normal. No respiratory distress.  Abdominal:     General: Abdomen is flat.     Palpations: Abdomen is soft.     Tenderness: There is abdominal tenderness (Left upper quadrant, suprapubic and right lower quadrant tenderness most severe).     Hernia: No hernia is present.  Skin:    General: Skin is warm and dry.     Findings: No rash.  Neurological:     Mental Status: He is alert.  Psychiatric:        Mood and Affect: Mood normal.     ED Results / Procedures / Treatments   Labs (all labs ordered are listed, but only abnormal results are displayed) Labs Reviewed  COMPREHENSIVE METABOLIC PANEL - Abnormal; Notable for the following components:      Result  Value   Total Protein 6.1 (*)    Total Bilirubin 1.8 (*)    All other components within normal limits  LIPASE, BLOOD  CBC  URINALYSIS, ROUTINE W REFLEX MICROSCOPIC    EKG None  Radiology CT ABDOMEN PELVIS W CONTRAST  Result Date: 06/07/2022 CLINICAL DATA:  Abdominal pain, acute, nonlocalized EXAM: CT ABDOMEN AND PELVIS WITH CONTRAST TECHNIQUE: Multidetector CT imaging of the abdomen and pelvis was performed using the standard protocol following bolus administration of intravenous contrast. RADIATION DOSE REDUCTION: This exam was performed according to the departmental dose-optimization program which includes automated exposure control, adjustment of the mA and/or kV according to patient size and/or use of iterative reconstruction  technique. CONTRAST:  98m OMNIPAQUE IOHEXOL 300 MG/ML  SOLN COMPARISON:  02/03/2021 FINDINGS: Lower chest: No pleural or pericardial effusion. Visualized lung bases clear. Hepatobiliary: No focal liver abnormality is seen. No gallstones, gallbladder wall thickening, or biliary dilatation. Pancreas: Unremarkable. No pancreatic ductal dilatation or surrounding inflammatory changes. Spleen: Normal in size. Scattered subcentimeter calcified granulomas as before. No new lesion. Adrenals/Urinary Tract: Adrenal glands are unremarkable. Kidneys are normal, without renal calculi, focal lesion, or hydronephrosis. Bladder is unremarkable. Stomach/Bowel: Stable changes of gastric bypass surgery, gastric components decompressed. Small bowel is nondilated. Normal appendix. The colon is incompletely distended by gas and fecal material, without acute finding. Vascular/Lymphatic: Scattered calcified aortic atheromatous plaque without aneurysm or stenosis. No abdominal or pelvic adenopathy. Portal vein patent. Reproductive: Prostate is normal in size, with coarse central calcifications. Other: No ascites.  No free air. Musculoskeletal: Interval changes of right inguinal hernia repair. Persistent right inguinal hernia containing only only fat. No ascites. No free air. Previous lumbar decompression surgery. No acute findings. IMPRESSION: 1. No acute findings. 2. Interval right inguinal hernia repair with persistent right inguinal hernia containing only fat. 3. Stable changes of gastric bypass surgery without apparent complication. 4.  Aortic Atherosclerosis (ICD10-I70.0). Electronically Signed   By: DLucrezia EuropeM.D.   On: 06/07/2022 14:08    Procedures Procedures   Medications Ordered in ED Medications  ondansetron (ZOFRAN) injection 4 mg (4 mg Intravenous Given 06/07/22 1220)  morphine (PF) 4 MG/ML injection 4 mg (4 mg Intravenous Given 06/07/22 1220)  iohexol (OMNIPAQUE) 300 MG/ML solution 100 mL (80 mLs Intravenous Contrast  Given 06/07/22 1332)  HYDROmorphone (DILAUDID) injection 0.5 mg (0.5 mg Intravenous Given 06/07/22 1433)    ED Course/ Medical Decision Making/ A&P                           Medical Decision Making Amount and/or Complexity of Data Reviewed Labs: ordered. Radiology: ordered.  Risk Prescription drug management.   52year old presenting with abdominal pain, NVD. The differential diagnosis for generalized abdominal pain includes, but is not limited to AAA, gastroenteritis, appendicitis, Bowel obstruction, Bowel perforation. Gastroparesis, DKA, Hernia, Inflammatory bowel disease, mesenteric ischemia, pancreatitis, peritonitis SBP, volvulus.   This is not an exhaustive differential.    Past Medical History / Co-morbidities / Social History: History of gastric bypass and IBS   Additional history: Per chart review patient had an umbilical hernia repair in May 2023.  In March 2023 he saw plastic surgery and met criteria for abdominal contouring.  This surgery has not yet been performed.   Physical Exam: Pertinent physical exam findings include Abdominal pain mostly generalized however most severe in the left upper, suprapubic and right lower regions  Lab Tests: I ordered, and personally interpreted labs.  The pertinent  results include: Unremarkable.  Mild elevation in T. bili but normal other hepatic enzymes.   Imaging Studies: I ordered and independently visualized and interpreted CT AP and I agree with the radiologist that there are no acute findings   Medications: Morphine and Zofran, on reevaluation patient says that morphine did not help him.  Given Dilaudid   MDM/Disposition: This is a 52 year old male presenting with generalized abdominal pain, nausea, vomiting and diarrhea.  Differential is broad.  Lab work was benign, CT scan without abnormalities or surgical concerns.  Suspect patient's symptoms are secondary to IBS for which she needs to follow-up with GI.  He is agreeable  to this.  Vital signs stable, no signs of sepsis, highly doubt missed intra-abdominal process.  Will be discharged with antiemetic and antispasmodic.  He is agreeable to this and will keep a food diary for GI follow-up.  Final Clinical Impression(s) / ED Diagnoses Final diagnoses:  Nausea and vomiting, unspecified vomiting type    Rx / DC Orders ED Discharge Orders          Ordered    ondansetron (ZOFRAN-ODT) 4 MG disintegrating tablet  Every 8 hours PRN        06/07/22 1211    Ambulatory referral to Gastroenterology        06/07/22 1255    dicyclomine (BENTYL) 20 MG tablet  2 times daily        06/07/22 1429           Results and diagnoses were explained to the patient. Return precautions discussed in full. Patient had no additional questions and expressed complete understanding.   This chart was dictated using voice recognition software.  Despite best efforts to proofread,  errors can occur which can change the documentation meaning.    Rhae Hammock, PA-C 06/07/22 [place], [employer], DO 06/07/22 1436

## 2022-06-07 NOTE — Discharge Instructions (Addendum)
You came to the emergency department due to abdominal pain, nausea, vomiting and diarrhea.  Your lab work and CT scan are unremarkable  It is very important for you to follow-up with a GI doctor.  You should receive a phone call from them within the next 72 hours, if you do not, please call the office on these discharge papers.  Zofran is the medication for nausea that I have sent to your pharmacy.  Bentyl is for stomach cramps.  Most importantly, you need to follow a bland diet and keep a food diary at this time.  Information about this is attached to these discharge papers.  Do not hesitate to return to the emergency department with any worsening symptoms.

## 2022-06-15 ENCOUNTER — Ambulatory Visit (INDEPENDENT_AMBULATORY_CARE_PROVIDER_SITE_OTHER): Payer: Medicaid Other | Admitting: Family Medicine

## 2022-06-15 VITALS — BP 140/100 | HR 80 | Temp 98.3°F | Ht 69.0 in | Wt 205.0 lb

## 2022-06-15 DIAGNOSIS — Z23 Encounter for immunization: Secondary | ICD-10-CM

## 2022-06-15 DIAGNOSIS — F319 Bipolar disorder, unspecified: Secondary | ICD-10-CM

## 2022-06-15 DIAGNOSIS — Z1211 Encounter for screening for malignant neoplasm of colon: Secondary | ICD-10-CM

## 2022-06-15 DIAGNOSIS — R197 Diarrhea, unspecified: Secondary | ICD-10-CM | POA: Diagnosis not present

## 2022-06-15 DIAGNOSIS — E291 Testicular hypofunction: Secondary | ICD-10-CM | POA: Diagnosis not present

## 2022-06-15 DIAGNOSIS — Z0000 Encounter for general adult medical examination without abnormal findings: Secondary | ICD-10-CM | POA: Insufficient documentation

## 2022-06-15 DIAGNOSIS — Z114 Encounter for screening for human immunodeficiency virus [HIV]: Secondary | ICD-10-CM

## 2022-06-15 DIAGNOSIS — Z125 Encounter for screening for malignant neoplasm of prostate: Secondary | ICD-10-CM

## 2022-06-15 DIAGNOSIS — R112 Nausea with vomiting, unspecified: Secondary | ICD-10-CM

## 2022-06-15 DIAGNOSIS — Z0001 Encounter for general adult medical examination with abnormal findings: Secondary | ICD-10-CM | POA: Diagnosis not present

## 2022-06-15 DIAGNOSIS — Z1159 Encounter for screening for other viral diseases: Secondary | ICD-10-CM

## 2022-06-15 DIAGNOSIS — I10 Essential (primary) hypertension: Secondary | ICD-10-CM | POA: Diagnosis not present

## 2022-06-15 DIAGNOSIS — N529 Male erectile dysfunction, unspecified: Secondary | ICD-10-CM

## 2022-06-15 HISTORY — DX: Nausea with vomiting, unspecified: R11.2

## 2022-06-15 MED ORDER — DICYCLOMINE HCL 20 MG PO TABS: 20.0000 mg | ORAL_TABLET | Freq: Two times a day (BID) | ORAL | 2 refills | Status: DC

## 2022-06-15 MED ORDER — BUPROPION HCL ER (XL) 300 MG PO TB24: 300.0000 mg | ORAL_TABLET | Freq: Every day | ORAL | 0 refills | Status: DC

## 2022-06-15 MED ORDER — SILDENAFIL CITRATE 20 MG PO TABS: ORAL_TABLET | ORAL | 0 refills | Status: DC

## 2022-06-15 MED ORDER — HYDROCHLOROTHIAZIDE 12.5 MG PO TABS: 12.5000 mg | ORAL_TABLET | Freq: Every day | ORAL | 3 refills | Status: DC

## 2022-06-15 MED ORDER — ONDANSETRON 4 MG PO TBDP: 4.0000 mg | ORAL_TABLET | Freq: Three times a day (TID) | ORAL | 1 refills | Status: DC | PRN

## 2022-06-15 NOTE — Assessment & Plan Note (Signed)
Blood pressure elevated in office today 140/100. He reports elevated readings at home as well near 140/100. Counseled on lifestyle modifications such as heart healthy diet and exercise. Will start Hydrochlorothiazide 12.5 mg daily. Denies chest pain, palpitations, shortness of breath, lightheadedness, swelling in extremities, seek medical care for any of the above. Follow up in one month.

## 2022-06-15 NOTE — Assessment & Plan Note (Signed)

## 2022-06-15 NOTE — Assessment & Plan Note (Signed)
Well controlled on Wellbutrin '300mg'$  daily, refill provided.

## 2022-06-15 NOTE — Patient Instructions (Signed)
It was great to meet you today and I'm excited to have you join the [facility] practice. I hope you had a positive experience today! If you feel so inclined, please feel free to recommend our practice to friends and family. Skyelar Swigart, FNP-C  

## 2022-06-15 NOTE — Progress Notes (Signed)
New Patient Office Visit  Subjective    Patient ID: Aaron Lozano, male    DOB: [birth year]  Age: 52 y.o. MRN: [id number]  CC:  Chief Complaint  Patient presents with   Establish Care    Est care/meds IBS/stomach problems for about, end up going to ED last week, pt already has a GI appt set    HPI Aaron Lozano presents to establish care. Oriented to practice routines and expectations. PMH as listed below. He has noticed an elevation in his blood pressure lately at home, he checks when he has headaches, it has been elevated the last few months close to 140/100 due to stress. He reports ongoing nausea and vomiting when he eats certain foods and the triggers vary, relieved with Zofran, this happens daily, he has tried eating less frequently, denies bloody emesis or stool, abdominal pain, heartburn, esophagitis. He does has a GI referral pending.  Cologuard ordered PSA ordered TDap and flu today, declines second shingles Currently vapes daily   Outpatient Encounter Medications as of 06/15/2022  Medication Sig   cetirizine (ZYRTEC) 10 MG tablet Take 10 mg by mouth daily.   hydrochlorothiazide (HYDRODIURIL) 12.5 MG tablet Take 1 tablet (12.5 mg total) by mouth daily.   [DISCONTINUED] acetaminophen (TYLENOL) 325 MG tablet Take 2 tablets (650 mg total) by mouth every 6 (six) hours. (Patient taking differently: Take 650 mg by mouth every 6 (six) hours as needed for headache.)   [DISCONTINUED] buPROPion (WELLBUTRIN XL) 150 MG 24 hr tablet TAKE 1 TABLET BY MOUTH EVERY DAY   [DISCONTINUED] dicyclomine (BENTYL) 20 MG tablet Take 1 tablet (20 mg total) by mouth 2 (two) times daily.   [DISCONTINUED] ondansetron (ZOFRAN-ODT) 4 MG disintegrating tablet Take 1 tablet (4 mg total) by mouth every 8 (eight) hours as needed for nausea or vomiting.   [DISCONTINUED] sildenafil (REVATIO) 20 MG tablet TAKE 5 TABLETS BY MOUTH EVERY OTHER DAY AS NEEDED   buPROPion (WELLBUTRIN XL) 300 MG 24 hr tablet Take 1 tablet (300  mg total) by mouth daily.   dicyclomine (BENTYL) 20 MG tablet Take 1 tablet (20 mg total) by mouth 2 (two) times daily.   ondansetron (ZOFRAN-ODT) 4 MG disintegrating tablet Take 1 tablet (4 mg total) by mouth every 8 (eight) hours as needed for nausea or vomiting.   sildenafil (REVATIO) 20 MG tablet Take 5 tablets by mouth every other day as needed 1 hour prior to sexual activity   [DISCONTINUED] Glycerin-Hypromellose-PEG 400 (DRY EYE RELIEF DROPS OP) Place 1 drop into both eyes daily as needed (Dry eye). (Patient not taking: Reported on 06/15/2022)   [DISCONTINUED] oxyCODONE-acetaminophen (PERCOCET) 5-325 MG tablet Take 1 tablet by mouth every 4 (four) hours as needed for severe pain. (Patient not taking: Reported on 06/15/2022)   No facility-administered encounter medications on file as of 06/15/2022.    Past Medical History:  Diagnosis Date   Arthritis    Bipolar 1 disorder, depressed ([place])    ED (erectile dysfunction)    Hypertension    No longer on medication after gastric bypass surgery   Inguinal hernia    Right   Nausea vomiting and diarrhea 06/15/2022   Pneumonia    x2   Umbilical hernia     Past Surgical History:  Procedure Laterality Date   ANKLE SURGERY     BACK SURGERY     x2   EXCISION METACARPAL MASS Right 08/10/2021   Procedure: RIGHT INDEX FINGER MASS EXCISION;  Surgeon: Milly Jakob, MD;  Location:  [facility];  Service: Orthopedics;  Laterality: Right;  PRE-OP BLOCK LENGHT OF SURGERY: 64 MINUTES   GASTRIC BYPASS     INGUINAL HERNIA REPAIR Left 1988   INGUINAL HERNIA REPAIR  2006   INGUINAL HERNIA REPAIR N/A 10/05/2021   Procedure: LAPAROSCOPIC RIGHT INGUINAL HERNIA REPAIR WITH MESH;  Surgeon: Stechschulte, Nickola Major, MD;  Location: [facility];  Service: General;  Laterality: N/A;   right bicep reattached     SHOULDER SURGERY     x2   UMBILICAL HERNIA REPAIR N/A 10/05/2021   Procedure: UMBILICAL HERNIA REPAIR WITH MESH;  Surgeon: Stechschulte, Nickola Major,  MD;  Location: [facility];  Service: General;  Laterality: N/A;    Family History  Problem Relation Age of Onset   Stroke Mother    Heart attack Father    Colon cancer Maternal Grandmother    Lung cancer Neg Hx    Prostate cancer Neg Hx     Social History   Socioeconomic History   Marital status: Divorced    Spouse name: Not on file   Number of children: 2   Years of education: Not on file   Highest education level: Not on file  Occupational History   Not on file  Tobacco Use   Smoking status: Never   Smokeless tobacco: Never  Vaping Use   Vaping Use: Some days  Substance and Sexual Activity   Alcohol use: Not Currently    Comment: 1-2 on weekends   Drug use: Never   Sexual activity: Not Currently  Other Topics Concern   Not on file  Social History Narrative   Lives with his fiance. Work for an [employer] Strain: Not on [employer] Insecurity: Not on file  Transportation Needs: Not on file  Physical Activity: Not on file  Stress: Not on file  Social Connections: Not on file  Intimate Partner Violence: Not on file    Review of Systems  Constitutional: Negative.   HENT: Negative.    Eyes: Negative.   Respiratory: Negative.    Cardiovascular: Negative.   Gastrointestinal:  Positive for diarrhea and nausea. Negative for abdominal pain, blood in stool and heartburn.  Genitourinary: Negative.   Musculoskeletal: Negative.   Skin: Negative.   Neurological: Negative.   Endo/Heme/Allergies: Negative.   Psychiatric/Behavioral: Negative.    All other systems reviewed and are negative.       Objective    BP (!) 140/100   Pulse 80   Temp 98.3 F (36.8 C) (Oral)   Ht '5\' 9"'$  (1.753 m)   Wt 205 lb (93 kg)   SpO2 96%   BMI 30.27 kg/m   Physical Exam Vitals and nursing note reviewed.  Constitutional:      Appearance: Normal appearance. He is normal weight.  HENT:     Head: Normocephalic and  atraumatic.     Right Ear: Ear canal and external ear normal. There is impacted cerumen.     Left Ear: Ear canal and external ear normal. There is impacted cerumen.     Nose: Nose normal.     Mouth/Throat:     Mouth: Mucous membranes are moist.     Pharynx: Oropharynx is clear.  Eyes:     Extraocular Movements: Extraocular movements intact.     Right eye: Normal extraocular motion and no nystagmus.     Left eye: Normal extraocular motion and no nystagmus.  Conjunctiva/sclera: Conjunctivae normal.     Pupils: Pupils are equal, round, and reactive to light.  Cardiovascular:     Rate and Rhythm: Normal rate and regular rhythm.     Pulses: Normal pulses.     Heart sounds: Normal heart sounds.  Pulmonary:     Effort: Pulmonary effort is normal.     Breath sounds: Normal breath sounds.  Abdominal:     General: Bowel sounds are normal.     Palpations: Abdomen is soft.  Genitourinary:    Comments: Deferred using shared decision making Musculoskeletal:        General: Normal range of motion.     Cervical back: Normal range of motion and neck supple.  Skin:    General: Skin is warm and dry.     Capillary Refill: Capillary refill takes less than 2 seconds.  Neurological:     General: No focal deficit present.     Mental Status: He is alert. Mental status is at baseline.  Psychiatric:        Mood and Affect: Mood normal.        Speech: Speech normal.        Behavior: Behavior normal.        Thought Content: Thought content normal.        Cognition and Memory: Cognition and memory normal.        Judgment: Judgment normal.         Assessment & Plan:   Problem List Items Addressed This Visit       Cardiovascular and Mediastinum   High blood pressure    Blood pressure elevated in office today 140/100. He reports elevated readings at home as well near 140/100. Counseled on lifestyle modifications such as heart healthy diet and exercise. Will start Hydrochlorothiazide 12.5 mg  daily. Denies chest pain, palpitations, shortness of breath, lightheadedness, swelling in extremities, seek medical care for any of the above. Follow up in one month.      Relevant Medications   hydrochlorothiazide (HYDRODIURIL) 12.5 MG tablet   sildenafil (REVATIO) 20 MG tablet   Other Relevant Orders   CBC with Differential/Platelet   COMPLETE METABOLIC PANEL WITH GFR   Lipid panel     Digestive   Nausea vomiting and diarrhea    No red flag symptoms. Improved with Bentyl '20mg'$  BID, refill ordered. Referral to GI in process, he will call to schedule appointment. Seek medical care for severe nausea and vomiting with abdominal pain, hematemesis, blood in stool.        Other   Bipolar 1 disorder, depressed ([place])    Well controlled on Wellbutrin '300mg'$  daily, refill provided.      Relevant Medications   buPROPion (WELLBUTRIN XL) 300 MG 24 hr tablet   Erectile dysfunction    Well controlled with '100mg'$  every other day Sildenafil as needed, will provide refill today.      Relevant Medications   sildenafil (REVATIO) 20 MG tablet   Physical exam, annual - Primary    Today your medical history was reviewed and routine physical exam with labs was performed. Recommend 150 minutes of moderate intensity exercise weekly and consuming a well-balanced diet. Advised to stop smoking if a smoker, avoid smoking if a non-smoker, limit alcohol consumption to 1 drink per day for women and 2 drinks per day for men, and avoid illicit drug use. Counseled on safe sex practices and offered STI testing today. Counseled on the importance of sunscreen use. Counseled in mental health awareness  and when to seek medical care. Vaccine maintenance discussed. Appropriate health maintenance items reviewed. Return to office in 1 year for annual physical exam.       Other Visit Diagnoses     Testosterone deficiency in male       Relevant Orders   Testosterone   Colon cancer screening       Relevant Orders    Cologuard   Prostate cancer screening       Relevant Orders   PSA   Screening for HIV (human immunodeficiency virus)       Relevant Orders   HIV Antibody (routine testing w rflx)   Need for hepatitis C screening test       Relevant Orders   Hepatitis C antibody       Return in about 4 weeks (around 07/13/2022) for blood pressure.   Rubie Maid, FNP

## 2022-06-15 NOTE — Assessment & Plan Note (Signed)
Well controlled with '100mg'$  every other day Sildenafil as needed, will provide refill today.

## 2022-06-15 NOTE — Assessment & Plan Note (Signed)
No red flag symptoms. Improved with Bentyl '20mg'$  BID, refill ordered. Referral to GI in process, he will call to schedule appointment. Seek medical care for severe nausea and vomiting with abdominal pain, hematemesis, blood in stool.

## 2022-06-16 ENCOUNTER — Ambulatory Visit: Payer: BC Managed Care – PPO | Admitting: Family Medicine

## 2022-06-16 LAB — CBC WITH DIFFERENTIAL/PLATELET
Absolute Monocytes: 340 cells/uL (ref 200–950)
Basophils Absolute: 41 cells/uL (ref 0–200)
Basophils Relative: 0.9 %
Eosinophils Absolute: 92 cells/uL (ref 15–500)
Eosinophils Relative: 2 %
HCT: 46.9 % (ref 38.5–50.0)
Hemoglobin: 15.9 g/dL (ref 13.2–17.1)
Lymphs Abs: 1164 cells/uL (ref 850–3900)
MCH: 29.8 pg (ref 27.0–33.0)
MCHC: 33.9 g/dL (ref 32.0–36.0)
MCV: 87.8 fL (ref 80.0–100.0)
MPV: 10.4 fL (ref 7.5–12.5)
Monocytes Relative: 7.4 %
Neutro Abs: 2962 cells/uL (ref 1500–7800)
Neutrophils Relative %: 64.4 %
Platelets: 244 10*3/uL (ref 140–400)
RBC: 5.34 10*6/uL (ref 4.20–5.80)
RDW: 11.8 % (ref 11.0–15.0)
Total Lymphocyte: 25.3 %
WBC: 4.6 10*3/uL (ref 3.8–10.8)

## 2022-06-16 LAB — COMPLETE METABOLIC PANEL WITH GFR
AG Ratio: 2.5 (calc) (ref 1.0–2.5)
ALT: 30 U/L (ref 9–46)
AST: 31 U/L (ref 10–35)
Albumin: 4.5 g/dL (ref 3.6–5.1)
Alkaline phosphatase (APISO): 51 U/L (ref 35–144)
BUN: 11 mg/dL (ref 7–25)
CO2: 28 mmol/L (ref 20–32)
Calcium: 9.2 mg/dL (ref 8.6–10.3)
Chloride: 106 mmol/L (ref 98–110)
Creat: 1.09 mg/dL (ref 0.70–1.30)
Globulin: 1.8 g/dL (calc) — ABNORMAL LOW (ref 1.9–3.7)
Glucose, Bld: 88 mg/dL (ref 65–99)
Potassium: 4.6 mmol/L (ref 3.5–5.3)
Sodium: 141 mmol/L (ref 135–146)
Total Bilirubin: 2.1 mg/dL — ABNORMAL HIGH (ref 0.2–1.2)
Total Protein: 6.3 g/dL (ref 6.1–8.1)
eGFR: 81 mL/min/{1.73_m2} (ref >=60)

## 2022-06-16 LAB — LIPID PANEL
Cholesterol: 152 mg/dL (ref <200)
HDL: 62 mg/dL (ref >=40)
LDL Cholesterol (Calc): 75 mg/dL (calc)
Non-HDL Cholesterol (Calc): 90 mg/dL (calc) (ref <130)
Total CHOL/HDL Ratio: 2.5 (calc) (ref <5.0)
Triglycerides: 66 mg/dL (ref <150)

## 2022-06-16 LAB — HIV ANTIBODY (ROUTINE TESTING W REFLEX): HIV 1&2 Ab, 4th Generation: NONREACTIVE

## 2022-06-16 LAB — HEPATITIS C ANTIBODY: Hepatitis C Ab: NONREACTIVE

## 2022-06-16 LAB — PSA: PSA: 0.34 ng/mL (ref <=4.00)

## 2022-06-16 LAB — TESTOSTERONE: Testosterone: 747 ng/dL (ref 250–827)

## 2022-06-17 ENCOUNTER — Other Ambulatory Visit: Payer: Self-pay | Admitting: Family Medicine

## 2022-06-17 ENCOUNTER — Other Ambulatory Visit (INDEPENDENT_AMBULATORY_CARE_PROVIDER_SITE_OTHER): Payer: Medicaid Other

## 2022-06-17 DIAGNOSIS — N529 Male erectile dysfunction, unspecified: Secondary | ICD-10-CM

## 2022-06-17 DIAGNOSIS — Z23 Encounter for immunization: Secondary | ICD-10-CM

## 2022-07-11 ENCOUNTER — Other Ambulatory Visit: Payer: Self-pay | Admitting: Family Medicine

## 2022-07-11 DIAGNOSIS — N529 Male erectile dysfunction, unspecified: Secondary | ICD-10-CM

## 2022-07-15 ENCOUNTER — Encounter: Payer: Self-pay | Admitting: Family Medicine

## 2022-07-15 ENCOUNTER — Ambulatory Visit: Payer: Medicaid Other | Admitting: Family Medicine

## 2022-07-15 VITALS — BP 130/100 | HR 83 | Temp 98.4°F | Ht 69.0 in | Wt 204.0 lb

## 2022-07-15 DIAGNOSIS — I10 Essential (primary) hypertension: Secondary | ICD-10-CM | POA: Diagnosis not present

## 2022-07-15 MED ORDER — HYDROCHLOROTHIAZIDE 25 MG PO TABS: 25.0000 mg | ORAL_TABLET | Freq: Every day | ORAL | 3 refills | Status: DC

## 2022-07-15 NOTE — Progress Notes (Signed)
Acute Office Visit  Subjective:     Patient ID: Aaron Lozano, male    DOB: [birth year], 52 y.o.   MRN: [id number]  Chief Complaint  Patient presents with   Follow-up    1 month f/u meds/B/P    HPI Patient is in today for blood pressure follow-up.  HYPERTENSION without Chronic Kidney Disease Hypertension status: better  Satisfied with current treatment? yes Duration of hypertension: months BP monitoring frequency:  not checking BP range:  BP medication side effects:  no Medication compliance: excellent compliance Previous BP meds:HCTZ Aspirin: no Recurrent headaches: no Visual changes: no Palpitations: no Dyspnea: no Chest pain: no Lower extremity edema: no Dizzy/lightheaded: no   Review of Systems  All other systems reviewed and are negative.   Past Medical History:  Diagnosis Date   Arthritis    Bipolar 1 disorder, depressed ([place])    ED (erectile dysfunction)    Hypertension    No longer on medication after gastric bypass surgery   Inguinal hernia    Right   Nausea vomiting and diarrhea 06/15/2022   Pneumonia    x2   Umbilical hernia    Past Surgical History:  Procedure Laterality Date   ANKLE SURGERY     BACK SURGERY     x2   EXCISION METACARPAL MASS Right 08/10/2021   Procedure: RIGHT INDEX FINGER MASS EXCISION;  Surgeon: Milly Jakob, MD;  Location: [facility];  Service: Orthopedics;  Laterality: Right;  PRE-OP BLOCK LENGHT OF SURGERY: 60 MINUTES   GASTRIC BYPASS     INGUINAL HERNIA REPAIR Left 1988   INGUINAL HERNIA REPAIR  2006   INGUINAL HERNIA REPAIR N/A 10/05/2021   Procedure: LAPAROSCOPIC RIGHT INGUINAL HERNIA REPAIR WITH MESH;  Surgeon: Stechschulte, Nickola Major, MD;  Location: [facility];  Service: General;  Laterality: N/A;   right bicep reattached     SHOULDER SURGERY     x2   UMBILICAL HERNIA REPAIR N/A 10/05/2021   Procedure: UMBILICAL HERNIA REPAIR WITH MESH;  Surgeon: Felicie Morn, MD;  Location: [facility];  Service:  General;  Laterality: N/A;   Current Outpatient Medications on File Prior to Visit  Medication Sig Dispense Refill   buPROPion (WELLBUTRIN XL) 300 MG 24 hr tablet Take 1 tablet (300 mg total) by mouth daily. 90 tablet 0   cetirizine (ZYRTEC) 10 MG tablet Take 10 mg by mouth daily.     dicyclomine (BENTYL) 20 MG tablet Take 1 tablet (20 mg total) by mouth 2 (two) times daily. 60 tablet 2   ondansetron (ZOFRAN-ODT) 4 MG disintegrating tablet Take 1 tablet (4 mg total) by mouth every 8 (eight) hours as needed for nausea or vomiting. 20 tablet 1   sildenafil (REVATIO) 20 MG tablet TAKE 5 TABLETS BY MOUTH EVERY OTHER DAY AS NEEDED 1 HOUR PRIOR TO SEXUAL ACTIVITY. 75 tablet 0   No current facility-administered medications on file prior to visit.   No Known Allergies     Objective:    BP (!) 130/100   Pulse 83   Temp 98.4 F (36.9 C) (Oral)   Ht 5' 9"$  (1.753 m)   Wt 204 lb (92.5 kg)   SpO2 97%   BMI 30.13 kg/m  BP Readings from Last 3 Encounters:  07/15/22 (!) 130/100  06/15/22 (!) 140/100  06/07/22 125/81      Physical Exam Vitals and nursing note reviewed.  Constitutional:      Appearance: Normal appearance. He is normal weight.  HENT:  Head: Normocephalic and atraumatic.  Cardiovascular:     Rate and Rhythm: Normal rate and regular rhythm.     Pulses: Normal pulses.     Heart sounds: Normal heart sounds.  Skin:    General: Skin is warm and dry.     Capillary Refill: Capillary refill takes less than 2 seconds.  Neurological:     General: No focal deficit present.     Mental Status: He is alert and oriented to person, place, and time. Mental status is at baseline.  Psychiatric:        Mood and Affect: Mood normal.        Behavior: Behavior normal.        Thought Content: Thought content normal.        Judgment: Judgment normal.     No results found for any visits on 07/15/22.      Assessment & Plan:   Problem List Items Addressed This Visit        Cardiovascular and Mediastinum   High blood pressure - Primary    Blood pressure remains elevated in office today 130/100. He has not been checking at home as he has been out of town. Reports compliance with HCTZ 12.57m, will increase to 250mdaily. Denies chest pain, palpitations, shortness of breath, lightheadedness, swelling in extremities and will seek medical care for any of the above. Encouraged to check BP at home several times weekly and report if values sustain >140/90. He requests a refill on his Sildenafil as well. Follow up in 2-4 weeks.      Relevant Medications   hydrochlorothiazide (HYDRODIURIL) 25 MG tablet   Other Relevant Orders   COMPLETE METABOLIC PANEL WITH GFR    Meds ordered this encounter  Medications   hydrochlorothiazide (HYDRODIURIL) 25 MG tablet    Sig: Take 1 tablet (25 mg total) by mouth daily.    Dispense:  90 tablet    Refill:  3    Order Specific Question:   Supervising Provider    Answer:   PIJenna Luo [[id number]]    Return in about 4 weeks (around 08/12/2022) for BP follow-up.  AmRubie MaidFNP

## 2022-07-15 NOTE — Assessment & Plan Note (Signed)
Blood pressure remains elevated in office today 130/100. He has not been checking at home as he has been out of town. Reports compliance with HCTZ 12.35m, will increase to 250mdaily. Denies chest pain, palpitations, shortness of breath, lightheadedness, swelling in extremities and will seek medical care for any of the above. Encouraged to check BP at home several times weekly and report if values sustain >140/90. He requests a refill on his Sildenafil as well. Follow up in 2-4 weeks.

## 2022-07-16 ENCOUNTER — Other Ambulatory Visit: Payer: Self-pay | Admitting: Family Medicine

## 2022-07-16 DIAGNOSIS — N529 Male erectile dysfunction, unspecified: Secondary | ICD-10-CM

## 2022-07-16 LAB — COMPLETE METABOLIC PANEL WITH GFR
AG Ratio: 1.9 (calc) (ref 1.0–2.5)
ALT: 30 U/L (ref 9–46)
AST: 22 U/L (ref 10–35)
Albumin: 4.2 g/dL (ref 3.6–5.1)
Alkaline phosphatase (APISO): 49 U/L (ref 35–144)
BUN: 15 mg/dL (ref 7–25)
CO2: 27 mmol/L (ref 20–32)
Calcium: 9.7 mg/dL (ref 8.6–10.3)
Chloride: 106 mmol/L (ref 98–110)
Creat: 1.02 mg/dL (ref 0.70–1.30)
Globulin: 2.2 g/dL (calc) (ref 1.9–3.7)
Glucose, Bld: 93 mg/dL (ref 65–99)
Potassium: 4.3 mmol/L (ref 3.5–5.3)
Sodium: 140 mmol/L (ref 135–146)
Total Bilirubin: 1.1 mg/dL (ref 0.2–1.2)
Total Protein: 6.4 g/dL (ref 6.1–8.1)
eGFR: 88 mL/min/{1.73_m2} (ref >=60)

## 2022-07-20 ENCOUNTER — Telehealth: Payer: Self-pay | Admitting: Family Medicine

## 2022-07-20 NOTE — Telephone Encounter (Signed)
Patient called to follow up on referral to GI provider; waiting for call to schedule. Patient stated the office he was originally referred to through the ED doesn't accept his insurance.  Please advise at [phone].

## 2022-08-09 ENCOUNTER — Other Ambulatory Visit: Payer: Self-pay | Admitting: Family Medicine

## 2022-08-09 DIAGNOSIS — N529 Male erectile dysfunction, unspecified: Secondary | ICD-10-CM

## 2022-08-17 ENCOUNTER — Ambulatory Visit (INDEPENDENT_AMBULATORY_CARE_PROVIDER_SITE_OTHER): Payer: Medicaid Other | Admitting: Family Medicine

## 2022-08-17 ENCOUNTER — Encounter: Payer: Self-pay | Admitting: Family Medicine

## 2022-08-17 VITALS — BP 160/100 | HR 99 | Temp 98.1°F | Ht 69.0 in | Wt 203.0 lb

## 2022-08-17 DIAGNOSIS — I10 Essential (primary) hypertension: Secondary | ICD-10-CM | POA: Diagnosis not present

## 2022-08-17 DIAGNOSIS — F419 Anxiety disorder, unspecified: Secondary | ICD-10-CM | POA: Diagnosis not present

## 2022-08-17 MED ORDER — ESCITALOPRAM OXALATE 10 MG PO TABS: 10.0000 mg | ORAL_TABLET | Freq: Every day | ORAL | 0 refills | Status: DC

## 2022-08-17 MED ORDER — LISINOPRIL 10 MG PO TABS: 10.0000 mg | ORAL_TABLET | Freq: Every day | ORAL | 0 refills | Status: DC

## 2022-08-17 NOTE — Assessment & Plan Note (Addendum)
Uncontrolled. Start Lexapro 10mg  daily. Continue Wellbutrin XL 300mg  daily. Follow up in 1 month.

## 2022-08-17 NOTE — Progress Notes (Signed)
Acute Office Visit  Subjective:     Patient ID: Aaron Lozano, male    DOB: [birth year], 52 y.o.   MRN: [id number]  No chief complaint on file.   HPI Patient is in today for blood pressure follow up. He reports having just been at the gym and taking a pre-workout (NO EXPLODE). He also endorses high stress and anxiety at home.  HYPERTENSION without Chronic Kidney Disease Hypertension status: uncontrolled  Satisfied with current treatment? yes Duration of hypertension: months BP monitoring frequency:  daily BP range: 140s/90s BP medication side effects:  no Medication compliance: excellent compliance Previous BP meds:HCTZ Aspirin: no Recurrent headaches: no Visual changes: no Palpitations: no Dyspnea: no Chest pain: no Lower extremity edema: no Dizzy/lightheaded: no   Review of Systems  All other systems reviewed and are negative.       Objective:    BP (!) 160/100   Pulse 99   Temp 98.1 F (36.7 C) (Oral)   Ht 5\' 9"  (1.753 m)   Wt 203 lb (92.1 kg)   SpO2 96%   BMI 29.98 kg/m  BP Readings from Last 3 Encounters:  08/17/22 (!) 160/100  07/15/22 (!) 130/100  06/15/22 (!) 140/100      Physical Exam Vitals and nursing note reviewed.  Constitutional:      Appearance: Normal appearance. He is normal weight.  HENT:     Head: Normocephalic and atraumatic.  Cardiovascular:     Rate and Rhythm: Normal rate and regular rhythm.     Pulses: Normal pulses.     Heart sounds: Normal heart sounds.  Pulmonary:     Effort: Pulmonary effort is normal.     Breath sounds: Normal breath sounds.  Skin:    General: Skin is warm and dry.     Capillary Refill: Capillary refill takes less than 2 seconds.  Neurological:     General: No focal deficit present.     Mental Status: He is alert and oriented to person, place, and time. Mental status is at baseline.  Psychiatric:        Mood and Affect: Mood normal.        Behavior: Behavior normal.        Thought Content:  Thought content normal.        Judgment: Judgment normal.     No results found for any visits on 08/17/22.      Assessment & Plan:   Problem List Items Addressed This Visit       Cardiovascular and Mediastinum   High blood pressure - Primary    Uncontrolled. Endorses high stress. Readings 140s/90s at home. Will start Lisinopril 10mg  daily and continue HCTZ 25mg  daily. Check CMP at follow-up in 1 month.      Relevant Medications   lisinopril (ZESTRIL) 10 MG tablet     Other   Anxiety    Uncontrolled. Start Lexapro 10mg  daily. Continue Wellbutrin XL 300mg  daily. Follow up in 1 month.      Relevant Medications   escitalopram (LEXAPRO) 10 MG tablet    Meds ordered this encounter  Medications   escitalopram (LEXAPRO) 10 MG tablet    Sig: Take 1 tablet (10 mg total) by mouth daily.    Dispense:  90 tablet    Refill:  0    Order Specific Question:   Supervising Provider    Answer:   Jenna Luo T [[id number]]   lisinopril (ZESTRIL) 10 MG tablet    Sig: Take 1 tablet (10  mg total) by mouth daily.    Dispense:  60 tablet    Refill:  0    Order Specific Question:   Supervising Provider    Answer:   Jenna Luo T [[id number]]    Return in about 4 weeks (around 09/14/2022) for BP.  Rubie Maid, FNP

## 2022-08-17 NOTE — Assessment & Plan Note (Signed)
Uncontrolled. Endorses high stress. Readings 140s/90s at home. Will start Lisinopril 10mg  daily and continue HCTZ 25mg  daily. Check CMP at follow-up in 1 month.

## 2022-08-21 ENCOUNTER — Encounter: Payer: Self-pay | Admitting: Family Medicine

## 2022-08-23 NOTE — Telephone Encounter (Signed)
Please advise on possible side effects  

## 2022-08-24 ENCOUNTER — Telehealth: Payer: Self-pay | Admitting: Family Medicine

## 2022-08-24 ENCOUNTER — Other Ambulatory Visit: Payer: Self-pay | Admitting: Family Medicine

## 2022-08-24 DIAGNOSIS — N529 Male erectile dysfunction, unspecified: Secondary | ICD-10-CM

## 2022-08-24 NOTE — Telephone Encounter (Signed)
Called patient to further discuss medication managment

## 2022-08-25 ENCOUNTER — Telehealth: Payer: Self-pay

## 2022-08-25 NOTE — Telephone Encounter (Signed)
Pt called in stating that he is having an issue getting this med refilled sildenafil (REVATIO) 20 MG tablet [id number]. Pt states that he only received 10 of these pills on 07/16/22, and needs to get this med refilled. Pt would like to speak with nurse.   Please call [phone]

## 2022-08-26 ENCOUNTER — Other Ambulatory Visit: Payer: Self-pay | Admitting: Family Medicine

## 2022-08-26 DIAGNOSIS — N529 Male erectile dysfunction, unspecified: Secondary | ICD-10-CM

## 2022-08-26 MED ORDER — SILDENAFIL CITRATE 20 MG PO TABS: ORAL_TABLET | ORAL | 0 refills | Status: DC

## 2022-09-11 ENCOUNTER — Other Ambulatory Visit: Payer: Self-pay | Admitting: Family Medicine

## 2022-09-11 DIAGNOSIS — F319 Bipolar disorder, unspecified: Secondary | ICD-10-CM

## 2022-09-28 NOTE — Telephone Encounter (Signed)
LVM for pt re med refills per pt DPR

## 2022-10-25 ENCOUNTER — Other Ambulatory Visit: Payer: Self-pay | Admitting: Family Medicine

## 2022-10-29 ENCOUNTER — Other Ambulatory Visit: Payer: Self-pay | Admitting: Family Medicine

## 2022-11-11 ENCOUNTER — Other Ambulatory Visit: Payer: Self-pay | Admitting: Family Medicine

## 2022-11-11 DIAGNOSIS — N529 Male erectile dysfunction, unspecified: Secondary | ICD-10-CM

## 2022-11-12 ENCOUNTER — Other Ambulatory Visit: Payer: Self-pay | Admitting: Family Medicine

## 2022-11-12 MED ORDER — SILDENAFIL CITRATE 20 MG PO TABS: ORAL_TABLET | ORAL | 0 refills | Status: DC

## 2022-11-18 DIAGNOSIS — F411 Generalized anxiety disorder: Secondary | ICD-10-CM | POA: Diagnosis not present

## 2022-11-18 DIAGNOSIS — F3181 Bipolar II disorder: Secondary | ICD-10-CM | POA: Diagnosis not present

## 2022-12-06 ENCOUNTER — Other Ambulatory Visit: Payer: Self-pay | Admitting: Family Medicine

## 2022-12-06 DIAGNOSIS — N529 Male erectile dysfunction, unspecified: Secondary | ICD-10-CM

## 2022-12-14 DIAGNOSIS — F3181 Bipolar II disorder: Secondary | ICD-10-CM | POA: Diagnosis not present

## 2022-12-14 DIAGNOSIS — F411 Generalized anxiety disorder: Secondary | ICD-10-CM | POA: Diagnosis not present

## 2022-12-16 ENCOUNTER — Other Ambulatory Visit: Payer: Self-pay | Admitting: Family Medicine

## 2022-12-16 ENCOUNTER — Encounter: Payer: Self-pay | Admitting: Family Medicine

## 2022-12-16 DIAGNOSIS — N529 Male erectile dysfunction, unspecified: Secondary | ICD-10-CM

## 2022-12-27 ENCOUNTER — Encounter (HOSPITAL_BASED_OUTPATIENT_CLINIC_OR_DEPARTMENT_OTHER): Payer: Self-pay | Admitting: Emergency Medicine

## 2022-12-27 ENCOUNTER — Emergency Department (HOSPITAL_BASED_OUTPATIENT_CLINIC_OR_DEPARTMENT_OTHER): Payer: BC Managed Care – PPO

## 2022-12-27 ENCOUNTER — Other Ambulatory Visit: Payer: Self-pay

## 2022-12-27 ENCOUNTER — Emergency Department (HOSPITAL_BASED_OUTPATIENT_CLINIC_OR_DEPARTMENT_OTHER)
Admission: EM | Admit: 2022-12-27 | Discharge: 2022-12-27 | Disposition: A | Discharge status: Home / Self Care | Payer: BC Managed Care – PPO | Attending: Emergency Medicine | Admitting: Emergency Medicine

## 2022-12-27 DIAGNOSIS — I10 Essential (primary) hypertension: Secondary | ICD-10-CM | POA: Diagnosis not present

## 2022-12-27 DIAGNOSIS — R109 Unspecified abdominal pain: Secondary | ICD-10-CM | POA: Diagnosis not present

## 2022-12-27 DIAGNOSIS — N50812 Left testicular pain: Secondary | ICD-10-CM | POA: Diagnosis not present

## 2022-12-27 DIAGNOSIS — Z79899 Other long term (current) drug therapy: Secondary | ICD-10-CM | POA: Insufficient documentation

## 2022-12-27 LAB — URINALYSIS, W/ REFLEX TO CULTURE (INFECTION SUSPECTED)
Bacteria, UA: NONE SEEN
Bilirubin Urine: NEGATIVE
Glucose, UA: NEGATIVE mg/dL
Hgb urine dipstick: NEGATIVE
Ketones, ur: NEGATIVE mg/dL
Leukocytes,Ua: NEGATIVE
Nitrite: NEGATIVE
Specific Gravity, Urine: 1.026 (ref 1.005–1.030)
pH: 6.5 (ref 5.0–8.0)

## 2022-12-27 LAB — COMPREHENSIVE METABOLIC PANEL
ALT: 28 U/L (ref 0–44)
AST: 27 U/L (ref 15–41)
Albumin: 4.1 g/dL (ref 3.5–5.0)
Alkaline Phosphatase: 40 U/L (ref 38–126)
Anion gap: 6 (ref 5–15)
BUN: 17 mg/dL (ref 6–20)
CO2: 27 mmol/L (ref 22–32)
Calcium: 8.6 mg/dL — ABNORMAL LOW (ref 8.9–10.3)
Chloride: 106 mmol/L (ref 98–111)
Creatinine, Ser: 1.03 mg/dL (ref 0.61–1.24)
GFR, Estimated: >60 mL/min (ref >60)
Glucose, Bld: 82 mg/dL (ref 70–99)
Potassium: 4.1 mmol/L (ref 3.5–5.1)
Sodium: 139 mmol/L (ref 135–145)
Total Bilirubin: 0.8 mg/dL (ref 0.3–1.2)
Total Protein: 6 g/dL — ABNORMAL LOW (ref 6.5–8.1)

## 2022-12-27 LAB — CBC WITH DIFFERENTIAL/PLATELET
Abs Immature Granulocytes: 0.02 10*3/uL (ref 0.00–0.07)
Basophils Absolute: 0.1 10*3/uL (ref 0.0–0.1)
Basophils Relative: 1 %
Eosinophils Absolute: 0.2 10*3/uL (ref 0.0–0.5)
Eosinophils Relative: 3 %
HCT: 42.7 % (ref 39.0–52.0)
Hemoglobin: 14.4 g/dL (ref 13.0–17.0)
Immature Granulocytes: 1 %
Lymphocytes Relative: 18 %
Lymphs Abs: 0.8 10*3/uL (ref 0.7–4.0)
MCH: 29.6 pg (ref 26.0–34.0)
MCHC: 33.7 g/dL (ref 30.0–36.0)
MCV: 87.7 fL (ref 80.0–100.0)
Monocytes Absolute: 0.5 10*3/uL (ref 0.1–1.0)
Monocytes Relative: 11 %
Neutro Abs: 2.9 10*3/uL (ref 1.7–7.7)
Neutrophils Relative %: 66 %
Platelets: 209 10*3/uL (ref 150–400)
RBC: 4.87 MIL/uL (ref 4.22–5.81)
RDW: 12.9 % (ref 11.5–15.5)
WBC: 4.4 10*3/uL (ref 4.0–10.5)
nRBC: 0 % (ref 0.0–0.2)

## 2022-12-27 MED ORDER — IOHEXOL 300 MG/ML  SOLN
100.0000 mL | Freq: Once | INTRAMUSCULAR | Status: AC | PRN
  Administered 2022-12-27: 85 mL via INTRAVENOUS

## 2022-12-27 MED ORDER — OXYCODONE-ACETAMINOPHEN 5-325 MG PO TABS
1.0000 | ORAL_TABLET | Freq: Once | ORAL | Status: AC
  Administered 2022-12-27: 1 via ORAL
  Filled 2022-12-27: qty 1

## 2022-12-27 NOTE — ED Notes (Signed)
ED Provider at bedside. 

## 2022-12-27 NOTE — ED Provider Notes (Signed)
[place] EMERGENCY DEPARTMENT AT [facility] Provider Note   CSN: [id number] Arrival date & time: 12/27/22  1645     History  Chief Complaint  Patient presents with   Testicle Pain    Aaron Lozano is a 52 y.o. male with PMHx erectile disfunction, bipolar 1 disorder, HTN, inguinal hernia who presents to ED concern for left testicle pain x1 week.  Also stating that he noticed a lump on his testicle last night.  Denies fever, chest pain, dyspnea, abdominal pain, nausea, vomiting.  Denies dysuria, hematuria, penile discharge, genital lesions.   Testicle Pain       Home Medications Prior to Admission medications   Medication Sig Start Date End Date Taking? Authorizing Provider  buPROPion (WELLBUTRIN XL) 300 MG 24 hr tablet TAKE 1 TABLET BY MOUTH EVERY DAY 09/13/22   Park Meo, FNP  cetirizine (ZYRTEC) 10 MG tablet Take 10 mg by mouth daily.    [provider]  dicyclomine (BENTYL) 20 MG tablet Take 1 tablet (20 mg total) by mouth 2 (two) times daily. 06/15/22   Park Meo, FNP  escitalopram (LEXAPRO) 10 MG tablet TAKE 1 TABLET BY MOUTH EVERY DAY 11/12/22   Park Meo, FNP  hydrochlorothiazide (HYDRODIURIL) 25 MG tablet Take 1 tablet (25 mg total) by mouth daily. 07/15/22   Park Meo, FNP  lisinopril (ZESTRIL) 10 MG tablet TAKE 1 TABLET BY MOUTH EVERY DAY 10/25/22   Park Meo, FNP  ondansetron (ZOFRAN-ODT) 4 MG disintegrating tablet TAKE 1 TABLET BY MOUTH EVERY 8 HOURS AS NEEDED FOR NAUSEA AND VOMITING 11/01/22   Donita Brooks, MD  sildenafil (REVATIO) 20 MG tablet TAKE 5 TABLETS BY MOUTH EVERY OTHER DAY AS NEEDED ONE  HOUR  PRIOR  TO  SEXUAL  ACTIVITY 12/06/22   Park Meo, FNP      Allergies    Patient has no known allergies.    Review of Systems   Review of Systems  Genitourinary:  Positive for testicular pain.    Physical Exam Updated Vital Signs BP 130/89 (BP Location: Right Arm)   Pulse (!) 53   Temp 97.8 F (36.6 C)    Resp 16   SpO2 96%  Physical Exam Vitals and nursing note reviewed.  Constitutional:      General: He is not in acute distress.    Appearance: He is not ill-appearing or toxic-appearing.  HENT:     Head: Normocephalic and atraumatic.     Mouth/Throat:     Mouth: Mucous membranes are moist.  Eyes:     General: No scleral icterus.       Right eye: No discharge.        Left eye: No discharge.     Conjunctiva/sclera: Conjunctivae normal.  Cardiovascular:     Rate and Rhythm: Normal rate and regular rhythm.     Pulses: Normal pulses.     Heart sounds: Normal heart sounds. No murmur heard. Pulmonary:     Effort: Pulmonary effort is normal.  Abdominal:     Tenderness: There is no abdominal tenderness.  Genitourinary:    Comments: Testicular exam with chaperon Ivin Booty, EMT Tenderness of left testicle. No skin changes or lesions. No penile discharge appreciated.  Musculoskeletal:     Right lower leg: No edema.     Left lower leg: No edema.  Skin:    General: Skin is warm and dry.     Findings: No rash.  Neurological:  General: No focal deficit present.     Mental Status: He is alert. Mental status is at baseline.  Psychiatric:        Mood and Affect: Mood normal.     ED Results / Procedures / Treatments   Labs (all labs ordered are listed, but only abnormal results are displayed) Labs Reviewed  COMPREHENSIVE METABOLIC PANEL - Abnormal; Notable for the following components:      Result Value   Calcium 8.6 (*)    Total Protein 6.0 (*)    All other components within normal limits  URINALYSIS, W/ REFLEX TO CULTURE (INFECTION SUSPECTED) - Abnormal; Notable for the following components:   Protein, ur TRACE (*)    All other components within normal limits  CBC WITH DIFFERENTIAL/PLATELET    EKG None  Radiology CT ABDOMEN PELVIS W CONTRAST  Result Date: 12/27/2022 CLINICAL DATA:  Acute abdominal pain EXAM: CT ABDOMEN AND PELVIS WITH CONTRAST TECHNIQUE: Multidetector CT  imaging of the abdomen and pelvis was performed using the standard protocol following bolus administration of intravenous contrast. RADIATION DOSE REDUCTION: This exam was performed according to the departmental dose-optimization program which includes automated exposure control, adjustment of the mA and/or kV according to patient size and/or use of iterative reconstruction technique. CONTRAST:  85mL OMNIPAQUE IOHEXOL 300 MG/ML  SOLN COMPARISON:  Scrotal ultrasound 12/27/2022. CT abdomen and pelvis 06/07/2022 FINDINGS: Lower chest: No acute abnormality. Hepatobiliary: No focal liver abnormality is seen. No gallstones, gallbladder wall thickening, or biliary dilatation. Pancreas: Unremarkable. No pancreatic ductal dilatation or surrounding inflammatory changes. Spleen: Calcified granulomas are present. Adrenals/Urinary Tract: Adrenal glands are unremarkable. Kidneys are normal, without renal calculi, focal lesion, or hydronephrosis. Bladder is unremarkable. Stomach/Bowel: Stomach is within normal limits. Appendix appears normal. No evidence of bowel wall thickening, distention, or inflammatory changes. Are postsurgical changes in small-bowel loops and stomach likely related to gastric bypass. Vascular/Lymphatic: Aortic atherosclerosis. No enlarged abdominal or pelvic lymph nodes. Reproductive: Prostate is unremarkable. Other: Small right inguinal hernia containing fat is unchanged. There surgical clips in the left inguinal canal. Patient is status post bilateral inguinal hernia repair. There is no ascites. Musculoskeletal: No fracture is seen. IMPRESSION: 1. No acute localizing process in the abdomen or pelvis. 2. Stable small right inguinal hernia containing fat. 3. Aortic atherosclerosis. Aortic Atherosclerosis (ICD10-I70.0). Electronically Signed   By: Darliss Cheney M.D.   On: 12/27/2022 21:04   US Scrotum  Result Date: 12/27/2022 CLINICAL DATA:  testicle pain EXAM: ULTRASOUND OF SCROTUM TECHNIQUE: Complete  ultrasound examination of the testicles, epididymis, and other scrotal structures was performed. COMPARISON:  None Available. FINDINGS: Right testicle Measurements: 3.9 x 2.1 x 2.5 cm. No mass or microlithiasis visualized. Left testicle Measurements: 3.7 x 1.8 x 12.4 cm. No mass or microlithiasis visualized. Right epididymis:  Normal in size and appearance. Left epididymis:  Normal in size and appearance. Hydrocele:  None visualized. Varicocele:  None visualized. IMPRESSION: Unremarkable scrotal ultrasound. Electronically Signed   By: Tish Frederickson M.D.   On: 12/27/2022 20:01    Procedures Procedures    Medications Ordered in ED Medications  oxyCODONE-acetaminophen (PERCOCET/ROXICET) 5-325 MG per tablet 1 tablet (1 tablet Oral Given 12/27/22 1821)  iohexol (OMNIPAQUE) 300 MG/ML solution 100 mL (85 mLs Intravenous Contrast Given 12/27/22 2036)    ED Course/ Medical Decision Making/ A&P  Medical Decision Making Amount and/or Complexity of Data Reviewed Labs: ordered. Radiology: ordered.  Risk Prescription drug management.   This patient presents to the ED for concern of testicular pain, this involves an extensive number of treatment options, and is a complaint that carries with it a high risk of complications and morbidity.  The differential diagnosis includes epididymitis, testicular cancer, testicular torsion.   Co morbidities that complicate the patient evaluation  ED, bipolar 1 disorder, HTN, inguinal hernia   Lab Tests:  I Ordered, and personally interpreted labs.  The pertinent results include:   -CBC: No concern for anemia or leukocytosis -CMP: no concern for electrolyte abnormality; no concern for kidney/liver damage -UA: Not concerning for infection   Imaging Studies ordered:  I ordered imaging studies including  - US scrotum: To assess for process contributing to patient's symptoms I independently visualized and interpreted imaging  I  agree with the radiologist interpretation   Problem List / ED Course / Critical interventions / Medication management  Patient presents emergency room concern for testicular pain for 1 week.  Physical exam with tenderness to palpation of left testicle.  No skin changes, increased warmth, or swelling.  UA without concern for infection.  CBC with out leukocytosis or anemia.  CMP reassuring. Testicular ultrasound without acute concern.  Due to miscommunication with [place] tech, doppler was not specifically obtained - however, I discussed with [place] tech afterwards who states that it does appear that both testicles are receiving similar blood flow. Patient also without vomiting or swelling/skin changes/warmth of testicle which is also reassuring against testicular torsion.  UA reassuring patient is not suffering from epididymitis at this time. Also obtain CT abdomen pelvis which did not show any acute concerns.  Patient with mild fat-containing inguinal hernia which may be exacerbating his symptoms. Shared results with patient.  recommended following up with urologist.  Patient verbally endorsed understanding of plan. I have reviewed the patients home medicines and have made adjustments as needed Patient afebrile with stable vitals.  Provided with return precautions.  Discharged in good condition.   Social Determinants of Health:  none           Final Clinical Impression(s) / ED Diagnoses Final diagnoses:  Pain in left testicle    Rx / DC Orders ED Discharge Orders     None         Margarita Rana 12/27/22 2145    Virgina Norfolk, DO 12/27/22 2314

## 2022-12-27 NOTE — Discharge Instructions (Addendum)
It was a pleasure caring for you today.  Please follow-up with the urologist provided in this discharge paperwork.  Seek emergency care for any new or worsening symptoms.

## 2022-12-27 NOTE — ED Notes (Signed)
 RN reviewed discharge instructions with pt. Pt verbalized understanding and had no further questions. VSS upon discharge.  

## 2022-12-27 NOTE — ED Triage Notes (Signed)
Testicle pain x 1 week (left) Noticed lump on testicle last night. Night sweats x 2-3 weeks

## 2022-12-27 NOTE — ED Notes (Signed)
Report given to the next RN... 

## 2022-12-28 ENCOUNTER — Telehealth: Payer: Self-pay

## 2022-12-28 NOTE — Transitions of Care (Post Inpatient/ED Visit) (Signed)
   12/28/2022  Name: Aaron Lozano MRN: [id number] DOB: 1970-11-18  Today's TOC FU Call Status: Today's TOC FU Call Status:: Unsuccessful Call (1st Attempt) Unsuccessful Call (1st Attempt) Date: 12/28/22  Attempted to reach the patient regarding the most recent Inpatient/ED visit.  Follow Up Plan: Additional outreach attempts will be made to reach the patient to complete the Transitions of Care (Post Inpatient/ED visit) call.   Signature Vicente Males, LPN [facility] AWV Team Direct Dial: [phone]

## 2022-12-29 NOTE — Transitions of Care (Post Inpatient/ED Visit) (Signed)
   12/29/2022  Name: Aaron Lozano MRN: [id number] DOB: 19-Mar-1971  Today's TOC FU Call Status: Today's TOC FU Call Status:: Successful TOC FU Call Completed Unsuccessful Call (1st Attempt) Date: 12/28/22 [facility] FU Call Complete Date: 12/29/22  Transition Care Management Follow-up Telephone Call Date of Discharge: 12/27/22 Discharge Facility: [facility] ([facility]) Type of Discharge: Emergency Department Reason for ED Visit: Other: How have you been since you were released from the hospital?: Same Any questions or concerns?: No  Items Reviewed: Did you receive and understand the discharge instructions provided?: Yes Medications obtained,verified, and reconciled?: Yes (Medications Reviewed) Any new allergies since your discharge?: No Dietary orders reviewed?: NA Do you have support at home?: Yes People in Home: significant other  Medications Reviewed Today: Medications Reviewed Today   Medications were not reviewed in this encounter     Home Care and Equipment/Supplies: Were Home Health Services Ordered?: No Any new equipment or medical supplies ordered?: No  Functional Questionnaire: Do you need assistance with bathing/showering or dressing?: No Do you need assistance with meal preparation?: No Do you need assistance with eating?: No Do you have difficulty maintaining continence: No Do you need assistance with getting out of bed/getting out of a chair/moving?: No Do you have difficulty managing or taking your medications?: No  Follow up appointments reviewed: PCP Follow-up appointment confirmed?: NA Specialist Hospital Follow-up appointment confirmed?: No Reason Specialist Follow-Up Not Confirmed: Patient has Specialist Provider Number and will Call for Appointment Do you need transportation to your follow-up appointment?: No Do you understand care options if your condition(s) worsen?: Yes-patient verbalized understanding    SIGNATURE tb,cma

## 2023-01-10 ENCOUNTER — Other Ambulatory Visit: Payer: Self-pay | Admitting: Family Medicine

## 2023-01-10 DIAGNOSIS — N529 Male erectile dysfunction, unspecified: Secondary | ICD-10-CM

## 2023-01-11 DIAGNOSIS — F411 Generalized anxiety disorder: Secondary | ICD-10-CM | POA: Diagnosis not present

## 2023-01-11 DIAGNOSIS — F3181 Bipolar II disorder: Secondary | ICD-10-CM | POA: Diagnosis not present

## 2023-01-20 DIAGNOSIS — F3181 Bipolar II disorder: Secondary | ICD-10-CM | POA: Diagnosis not present

## 2023-01-20 DIAGNOSIS — F411 Generalized anxiety disorder: Secondary | ICD-10-CM | POA: Diagnosis not present

## 2023-01-21 ENCOUNTER — Other Ambulatory Visit: Payer: Self-pay | Admitting: Family Medicine

## 2023-01-21 NOTE — Telephone Encounter (Signed)
Requested medication (s) are due for refill today - yes  Requested medication (s) are on the active medication list -yes  Future visit scheduled -no  Last refill: 11/01/22 #20 1RF  Notes to clinic: non delegated Rx  Requested Prescriptions  Pending Prescriptions Disp Refills   ondansetron (ZOFRAN-ODT) 4 MG disintegrating tablet [Pharmacy Med Name: ONDANSETRON ODT 4 MG TABLET] 20 tablet 1    Sig: TAKE 1 TABLET BY MOUTH EVERY 8 HOURS AS NEEDED FOR NAUSEA AND VOMITING     Not Delegated - Gastroenterology: Antiemetics - ondansetron Failed - 01/21/2023 11:39 AM      Failed - This refill cannot be delegated      Failed - Valid encounter within last 6 months    Recent Outpatient Visits   None            Passed - AST in normal range and within 360 days    AST  Date Value Ref Range Status  12/27/2022 27 15 - 41 U/L Final         Passed - ALT in normal range and within 360 days    ALT  Date Value Ref Range Status  12/27/2022 28 0 - 44 U/L Final            Requested Prescriptions  Pending Prescriptions Disp Refills   ondansetron (ZOFRAN-ODT) 4 MG disintegrating tablet [Pharmacy Med Name: ONDANSETRON ODT 4 MG TABLET] 20 tablet 1    Sig: TAKE 1 TABLET BY MOUTH EVERY 8 HOURS AS NEEDED FOR NAUSEA AND VOMITING     Not Delegated - Gastroenterology: Antiemetics - ondansetron Failed - 01/21/2023 11:39 AM      Failed - This refill cannot be delegated      Failed - Valid encounter within last 6 months    Recent Outpatient Visits   None            Passed - AST in normal range and within 360 days    AST  Date Value Ref Range Status  12/27/2022 27 15 - 41 U/L Final         Passed - ALT in normal range and within 360 days    ALT  Date Value Ref Range Status  12/27/2022 28 0 - 44 U/L Final

## 2023-01-24 ENCOUNTER — Other Ambulatory Visit: Payer: Self-pay | Admitting: Family Medicine

## 2023-01-27 ENCOUNTER — Other Ambulatory Visit: Payer: Self-pay | Admitting: Family Medicine

## 2023-01-27 DIAGNOSIS — F319 Bipolar disorder, unspecified: Secondary | ICD-10-CM

## 2023-01-31 DIAGNOSIS — F3181 Bipolar II disorder: Secondary | ICD-10-CM | POA: Diagnosis not present

## 2023-01-31 DIAGNOSIS — F411 Generalized anxiety disorder: Secondary | ICD-10-CM | POA: Diagnosis not present

## 2023-02-08 ENCOUNTER — Other Ambulatory Visit: Payer: Self-pay | Admitting: Family Medicine

## 2023-02-08 DIAGNOSIS — N529 Male erectile dysfunction, unspecified: Secondary | ICD-10-CM

## 2023-02-08 DIAGNOSIS — F3181 Bipolar II disorder: Secondary | ICD-10-CM | POA: Diagnosis not present

## 2023-02-08 DIAGNOSIS — F411 Generalized anxiety disorder: Secondary | ICD-10-CM | POA: Diagnosis not present

## 2023-02-13 ENCOUNTER — Other Ambulatory Visit: Payer: Self-pay | Admitting: Family Medicine

## 2023-02-14 DIAGNOSIS — F3181 Bipolar II disorder: Secondary | ICD-10-CM | POA: Diagnosis not present

## 2023-02-14 DIAGNOSIS — F411 Generalized anxiety disorder: Secondary | ICD-10-CM | POA: Diagnosis not present

## 2023-02-17 DIAGNOSIS — F3181 Bipolar II disorder: Secondary | ICD-10-CM | POA: Diagnosis not present

## 2023-02-17 DIAGNOSIS — F411 Generalized anxiety disorder: Secondary | ICD-10-CM | POA: Diagnosis not present

## 2023-02-24 DIAGNOSIS — F411 Generalized anxiety disorder: Secondary | ICD-10-CM | POA: Diagnosis not present

## 2023-02-24 DIAGNOSIS — F3181 Bipolar II disorder: Secondary | ICD-10-CM | POA: Diagnosis not present

## 2023-03-01 ENCOUNTER — Telehealth: Payer: Self-pay

## 2023-03-01 DIAGNOSIS — N529 Male erectile dysfunction, unspecified: Secondary | ICD-10-CM

## 2023-03-01 NOTE — Telephone Encounter (Signed)
Prescription Request  03/01/2023  LOV: 08/17/22  What is the name of the medication or equipment? sildenafil (REVATIO) 20 MG tablet [[id number]]  Have you contacted your pharmacy to request a refill? Yes   Which pharmacy would you like this sent to?  [id number] - [place] ([place]), [place] - [address]  ([place]) [place] [address] Phone: [phone] Fax: [phone]    Patient notified that their request is being sent to the clinical staff for review and that they should receive a response within 2 business days.   Please advise at [facility] [phone]

## 2023-03-01 NOTE — Telephone Encounter (Signed)
Patient called, left detailed VM per DPR to call and schedule OV prior to refill of sildenafil. Noted by provider in the 02/08/23 refill encounter.

## 2023-03-08 DIAGNOSIS — F411 Generalized anxiety disorder: Secondary | ICD-10-CM | POA: Diagnosis not present

## 2023-03-08 DIAGNOSIS — F3181 Bipolar II disorder: Secondary | ICD-10-CM | POA: Diagnosis not present

## 2023-03-15 DIAGNOSIS — F3181 Bipolar II disorder: Secondary | ICD-10-CM | POA: Diagnosis not present

## 2023-03-15 DIAGNOSIS — F411 Generalized anxiety disorder: Secondary | ICD-10-CM | POA: Diagnosis not present

## 2023-03-21 ENCOUNTER — Encounter: Payer: Self-pay | Admitting: Family Medicine

## 2023-03-21 ENCOUNTER — Ambulatory Visit (INDEPENDENT_AMBULATORY_CARE_PROVIDER_SITE_OTHER): Payer: BC Managed Care – PPO | Admitting: Family Medicine

## 2023-03-21 VITALS — BP 130/88 | HR 72 | Temp 98.5°F | Ht 69.0 in | Wt 218.0 lb

## 2023-03-21 DIAGNOSIS — F319 Bipolar disorder, unspecified: Secondary | ICD-10-CM

## 2023-03-21 DIAGNOSIS — N529 Male erectile dysfunction, unspecified: Secondary | ICD-10-CM | POA: Diagnosis not present

## 2023-03-21 DIAGNOSIS — E291 Testicular hypofunction: Secondary | ICD-10-CM | POA: Insufficient documentation

## 2023-03-21 DIAGNOSIS — Z23 Encounter for immunization: Secondary | ICD-10-CM

## 2023-03-21 DIAGNOSIS — I10 Essential (primary) hypertension: Secondary | ICD-10-CM

## 2023-03-21 DIAGNOSIS — R5383 Other fatigue: Secondary | ICD-10-CM

## 2023-03-21 DIAGNOSIS — F17290 Nicotine dependence, other tobacco product, uncomplicated: Secondary | ICD-10-CM | POA: Insufficient documentation

## 2023-03-21 MED ORDER — SILDENAFIL CITRATE 20 MG PO TABS
ORAL_TABLET | ORAL | 0 refills | Status: DC
Start: 2023-03-21 — End: 2023-04-18

## 2023-03-21 MED ORDER — VARENICLINE TARTRATE 1 MG PO TABS: 0.5000 mg | ORAL_TABLET | Freq: Two times a day (BID) | ORAL | 0 refills | Status: DC

## 2023-03-21 MED ORDER — VARENICLINE TARTRATE 1 MG PO TABS: ORAL_TABLET | ORAL | 0 refills | Status: AC

## 2023-03-21 MED ORDER — BUPROPION HCL ER (XL) 300 MG PO TB24
300.0000 mg | ORAL_TABLET | Freq: Every day | ORAL | 0 refills | Status: DC
Start: 2023-03-21 — End: 2023-07-07

## 2023-03-21 NOTE — Assessment & Plan Note (Signed)
Well controlled with '100mg'$  every other day Sildenafil as needed, will provide refill today.

## 2023-03-21 NOTE — Assessment & Plan Note (Signed)
3-5 minute discussion regarding the harms of tobacco use, the benefits of cessation, and methods of cessation. Discussed that there are medication options to help with cessation. Provided printed education on steps to quit smoking. Patient is desiring a medication to help. Will start Chantix 0.5mg  daily for 3 days then increase to BID for days 4-7 then increase to 1mg  BID for up to 11 weeks.

## 2023-03-21 NOTE — Assessment & Plan Note (Signed)
Chronic well controlled. Continue Lisinopril 10mg  daily and hydrochlorothiazide 25mg  daily. Recommend heart healthy diet such as Mediterranean diet with whole grains, fruits, vegetable, fish, lean meats, nuts, and olive oil. Limit salt. Encouraged moderate walking, 3-5 times/week for 30-50 minutes each session. Aim for at least 150 minutes.week. Goal should be pace of 3 miles/hours, or walking 1.5 miles in 30 minutes. Avoid tobacco products. Avoid excess alcohol. Take medications as prescribed and bring medications and blood pressure log with cuff to each office visit. Seek medical care for chest pain, palpitations, shortness of breath with exertion, dizziness/lightheadedness, vision changes, recurrent headaches, or swelling of extremities.

## 2023-03-21 NOTE — Assessment & Plan Note (Signed)
Rechecking testosterone levels today.

## 2023-03-21 NOTE — Progress Notes (Signed)
Subjective:  HPI: Aaron Lozano is a 52 y.o. male presenting on 03/21/2023 for Follow-up (Lethargic, testosterone, sildenafil./Per pt would like to take something to quit vaping as well)   HPI Patient is in today for follow-up for testosterone and sildenafil refills. He does endorse fatigue since stopping testosterone and would like his Vitamin B12 checked as well. He was previously taking Testosterone 300mg  weekly but has not been taking this for months due to lapse in prescription from his previous provider in [place]. He would also like to quit vaping, he smokes disposable nicotine vapes, amount varies. He has tried lozenges and patches without success.   Review of Systems  All other systems reviewed and are negative.   Relevant past medical history reviewed and updated as indicated.   Past Medical History:  Diagnosis Date   Arthritis    Bipolar 1 disorder, depressed (HCC)    ED (erectile dysfunction)    Hypertension    No longer on medication after gastric bypass surgery   Inguinal hernia    Right   Nausea vomiting and diarrhea 06/15/2022   Pneumonia    x2   Umbilical hernia      Past Surgical History:  Procedure Laterality Date   ANKLE SURGERY     BACK SURGERY     x2   EXCISION METACARPAL MASS Right 08/10/2021   Procedure: RIGHT INDEX FINGER MASS EXCISION;  Surgeon: Mack Hook, MD;  Location: [facility];  Service: Orthopedics;  Laterality: Right;  PRE-OP BLOCK LENGHT OF SURGERY: 60 MINUTES   GASTRIC BYPASS     INGUINAL HERNIA REPAIR Left 1988   INGUINAL HERNIA REPAIR  2006   INGUINAL HERNIA REPAIR N/A 10/05/2021   Procedure: LAPAROSCOPIC RIGHT INGUINAL HERNIA REPAIR WITH MESH;  Surgeon: Stechschulte, Hyman Hopes, MD;  Location: [facility];  Service: General;  Laterality: N/A;   right bicep reattached     SHOULDER SURGERY     x2   UMBILICAL HERNIA REPAIR N/A 10/05/2021   Procedure: UMBILICAL HERNIA REPAIR WITH MESH;  Surgeon: Stechschulte, Hyman Hopes, MD;  Location:  [facility];  Service: General;  Laterality: N/A;    Allergies and medications reviewed and updated.   Current Outpatient Medications:    cetirizine (ZYRTEC) 10 MG tablet, Take 10 mg by mouth daily., Disp: , Rfl:    dicyclomine (BENTYL) 20 MG tablet, TAKE 1 TABLET BY MOUTH TWICE A DAY, Disp: 180 tablet, Rfl: 1   hydrochlorothiazide (HYDRODIURIL) 25 MG tablet, Take 1 tablet (25 mg total) by mouth daily., Disp: 90 tablet, Rfl: 3   lisinopril (ZESTRIL) 10 MG tablet, TAKE 1 TABLET BY MOUTH EVERY DAY, Disp: 90 tablet, Rfl: 1   ondansetron (ZOFRAN-ODT) 4 MG disintegrating tablet, TAKE 1 TABLET BY MOUTH EVERY 8 HOURS AS NEEDED FOR NAUSEA AND VOMITING, Disp: 20 tablet, Rfl: 1   Oxcarbazepine (TRILEPTAL) 300 MG tablet, Take 300 mg by mouth daily., Disp: , Rfl:    buPROPion (WELLBUTRIN XL) 300 MG 24 hr tablet, Take 1 tablet (300 mg total) by mouth daily., Disp: 90 tablet, Rfl: 0   sildenafil (REVATIO) 20 MG tablet, TAKE 5 TABLETS BY MOUTH EVERY OTHER DAY AS NEEDED 1  HOUR  PRIOR  TO  SEXUAL  ACTIVITY, Disp: 75 tablet, Rfl: 0   varenicline (CHANTIX) 1 MG tablet, Take 0.5 tablets (0.5 mg total) by mouth 2 (two) times daily for 7 days, THEN 1 tablet (1 mg total) 2 (two) times daily. Take 0.5 tablet (0.5 mg) by mouth daily on  days 1-3, then increase to 0.5 tablet twice daily for days 4-7, then increase to 1 tablet (1 mg total) twice daily., Disp: 127 tablet, Rfl: 0  No Known Allergies  Objective:   BP 130/88   Pulse 72   Temp 98.5 F (36.9 C) (Oral)   Ht 5\' 9"  (1.753 m)   Wt 218 lb (98.9 kg)   SpO2 98%   BMI 32.19 kg/m      03/21/2023    2:37 PM 12/27/2022    9:45 PM 12/27/2022    8:30 PM  Vitals with BMI  Height 5\' 9"     Weight 218 lbs    BMI 32.18    Systolic 130 137 161  Diastolic 88 93 89  Pulse 72 59 53     Physical Exam Vitals and nursing note reviewed.  Constitutional:      Appearance: Normal appearance. He is normal weight.  HENT:     Head: Normocephalic and atraumatic.   Cardiovascular:     Rate and Rhythm: Normal rate and regular rhythm.     Pulses: Normal pulses.     Heart sounds: Normal heart sounds.  Pulmonary:     Effort: Pulmonary effort is normal.     Breath sounds: Normal breath sounds.  Skin:    General: Skin is warm and dry.     Capillary Refill: Capillary refill takes less than 2 seconds.  Neurological:     General: No focal deficit present.     Mental Status: He is alert and oriented to person, place, and time. Mental status is at baseline.  Psychiatric:        Mood and Affect: Mood normal.        Behavior: Behavior normal.        Thought Content: Thought content normal.        Judgment: Judgment normal.     Assessment & Plan:  Testosterone deficiency in male Assessment & Plan: Rechecking testosterone levels today.   Bipolar 1 disorder, depressed (HCC) Assessment & Plan: Well controlled on Wellbutrin 300mg  daily, refill provided. He is see a behavioral health specialist as well.   Orders: -     buPROPion HCl ER (XL); Take 1 tablet (300 mg total) by mouth daily.  Dispense: 90 tablet; Refill: 0  Erectile dysfunction, unspecified erectile dysfunction type Assessment & Plan: Well controlled with 100mg  every other day Sildenafil as needed, will provide refill today.  Orders: -     Sildenafil Citrate; TAKE 5 TABLETS BY MOUTH EVERY OTHER DAY AS NEEDED 1  HOUR  PRIOR  TO  SEXUAL  ACTIVITY  Dispense: 75 tablet; Refill: 0  Other fatigue -     Testosterone , Free and Total -     Vitamin B12 -     COMPLETE METABOLIC PANEL WITH GFR -     CBC with Differential/Platelet  Hypertension, unspecified type Assessment & Plan: Chronic well controlled. Continue Lisinopril 10mg  daily and hydrochlorothiazide 25mg  daily. Recommend heart healthy diet such as Mediterranean diet with whole grains, fruits, vegetable, fish, lean meats, nuts, and olive oil. Limit salt. Encouraged moderate walking, 3-5 times/week for 30-50 minutes each session. Aim for  at least 150 minutes.week. Goal should be pace of 3 miles/hours, or walking 1.5 miles in 30 minutes. Avoid tobacco products. Avoid excess alcohol. Take medications as prescribed and bring medications and blood pressure log with cuff to each office visit. Seek medical care for chest pain, palpitations, shortness of breath with exertion, dizziness/lightheadedness, vision changes, recurrent  headaches, or swelling of extremities.   Orders: -     COMPLETE METABOLIC PANEL WITH GFR -     CBC with Differential/Platelet  Need for vaccination -     Varicella-zoster vaccine IM  Nicotine dependence due to vaping tobacco product Assessment & Plan: 3-5 minute discussion regarding the harms of tobacco use, the benefits of cessation, and methods of cessation. Discussed that there are medication options to help with cessation. Provided printed education on steps to quit smoking. Patient is desiring a medication to help. Will start Chantix 0.5mg  daily for 3 days then increase to BID for days 4-7 then increase to 1mg  BID for up to 11 weeks.     Other orders -     Varenicline Tartrate; Take 0.5 tablets (0.5 mg total) by mouth 2 (two) times daily for 7 days, THEN 1 tablet (1 mg total) 2 (two) times daily. Take 0.5 tablet (0.5 mg) by mouth daily on days 1-3, then increase to 0.5 tablet twice daily for days 4-7, then increase to 1 tablet (1 mg total) twice daily.  Dispense: 127 tablet; Refill: 0     Follow up plan: Return in about 3 months (around 06/21/2023) for hypertension.  Park Meo, FNP

## 2023-03-21 NOTE — Assessment & Plan Note (Signed)
Well controlled on Wellbutrin 300mg  daily, refill provided. He is see a behavioral health specialist as well.

## 2023-03-22 ENCOUNTER — Encounter: Payer: Self-pay | Admitting: Family Medicine

## 2023-03-22 LAB — CBC WITH DIFFERENTIAL/PLATELET
Absolute Lymphocytes: 1109 {cells}/uL (ref 850–3900)
Absolute Monocytes: 433 {cells}/uL (ref 200–950)
Basophils Absolute: 50 {cells}/uL (ref 0–200)
Basophils Relative: 1.2 %
Eosinophils Absolute: 92 {cells}/uL (ref 15–500)
Eosinophils Relative: 2.2 %
HCT: 46.2 % (ref 38.5–50.0)
Hemoglobin: 15.4 g/dL (ref 13.2–17.1)
MCH: 30.1 pg (ref 27.0–33.0)
MCHC: 33.3 g/dL (ref 32.0–36.0)
MCV: 90.2 fL (ref 80.0–100.0)
MPV: 10.4 fL (ref 7.5–12.5)
Monocytes Relative: 10.3 %
Neutro Abs: 2516 {cells}/uL (ref 1500–7800)
Neutrophils Relative %: 59.9 %
Platelets: 214 10*3/uL (ref 140–400)
RBC: 5.12 10*6/uL (ref 4.20–5.80)
RDW: 11.9 % (ref 11.0–15.0)
Total Lymphocyte: 26.4 %
WBC: 4.2 10*3/uL (ref 3.8–10.8)

## 2023-03-24 ENCOUNTER — Telehealth: Payer: Self-pay

## 2023-03-24 NOTE — Telephone Encounter (Signed)
Pt called in wanting to speak with nurse/NP about recent lab results. Pt stated that he was waited on a med to be sent in based on these results. Please advise  Cb#: [phone]

## 2023-03-25 LAB — COMPLETE METABOLIC PANEL WITH GFR
AG Ratio: 2.1 (calc) (ref 1.0–2.5)
ALT: 42 U/L (ref 9–46)
AST: 31 U/L (ref 10–35)
Albumin: 4.2 g/dL (ref 3.6–5.1)
Alkaline phosphatase (APISO): 50 U/L (ref 35–144)
BUN: 18 mg/dL (ref 7–25)
CO2: 25 mmol/L (ref 20–32)
Calcium: 9.1 mg/dL (ref 8.6–10.3)
Chloride: 107 mmol/L (ref 98–110)
Creat: 0.87 mg/dL (ref 0.70–1.30)
Globulin: 2 g/dL (ref 1.9–3.7)
Glucose, Bld: 91 mg/dL (ref 65–99)
Potassium: 4.3 mmol/L (ref 3.5–5.3)
Sodium: 141 mmol/L (ref 135–146)
Total Bilirubin: 0.6 mg/dL (ref 0.2–1.2)
Total Protein: 6.2 g/dL (ref 6.1–8.1)
eGFR: 103 mL/min/{1.73_m2} (ref >=60)

## 2023-03-25 LAB — TESTOSTERONE, FREE & TOTAL
Free Testosterone: 31.1 pg/mL — ABNORMAL LOW (ref 35.0–155.0)
Testosterone, Total, LC-MS-MS: 219 ng/dL — ABNORMAL LOW (ref 250–1100)

## 2023-03-25 LAB — VITAMIN B12: Vitamin B-12: 292 pg/mL (ref 200–1100)

## 2023-03-29 ENCOUNTER — Other Ambulatory Visit: Payer: Self-pay | Admitting: Family Medicine

## 2023-03-30 ENCOUNTER — Other Ambulatory Visit: Payer: Self-pay | Admitting: Family Medicine

## 2023-03-30 DIAGNOSIS — E291 Testicular hypofunction: Secondary | ICD-10-CM

## 2023-03-30 DIAGNOSIS — N529 Male erectile dysfunction, unspecified: Secondary | ICD-10-CM

## 2023-03-31 DIAGNOSIS — F411 Generalized anxiety disorder: Secondary | ICD-10-CM | POA: Diagnosis not present

## 2023-03-31 DIAGNOSIS — F3181 Bipolar II disorder: Secondary | ICD-10-CM | POA: Diagnosis not present

## 2023-04-11 DIAGNOSIS — F3181 Bipolar II disorder: Secondary | ICD-10-CM | POA: Diagnosis not present

## 2023-04-11 DIAGNOSIS — F411 Generalized anxiety disorder: Secondary | ICD-10-CM | POA: Diagnosis not present

## 2023-04-16 ENCOUNTER — Other Ambulatory Visit: Payer: Self-pay | Admitting: Family Medicine

## 2023-04-16 DIAGNOSIS — N529 Male erectile dysfunction, unspecified: Secondary | ICD-10-CM

## 2023-04-18 ENCOUNTER — Other Ambulatory Visit: Payer: Self-pay | Admitting: Family Medicine

## 2023-04-27 DIAGNOSIS — F411 Generalized anxiety disorder: Secondary | ICD-10-CM | POA: Diagnosis not present

## 2023-04-27 DIAGNOSIS — F3181 Bipolar II disorder: Secondary | ICD-10-CM | POA: Diagnosis not present

## 2023-05-05 ENCOUNTER — Ambulatory Visit: Payer: BC Managed Care – PPO | Admitting: Urology

## 2023-05-13 DIAGNOSIS — F411 Generalized anxiety disorder: Secondary | ICD-10-CM | POA: Diagnosis not present

## 2023-05-13 DIAGNOSIS — F3181 Bipolar II disorder: Secondary | ICD-10-CM | POA: Diagnosis not present

## 2023-05-17 DIAGNOSIS — F411 Generalized anxiety disorder: Secondary | ICD-10-CM | POA: Diagnosis not present

## 2023-05-17 DIAGNOSIS — F1020 Alcohol dependence, uncomplicated: Secondary | ICD-10-CM | POA: Diagnosis not present

## 2023-05-17 DIAGNOSIS — F3181 Bipolar II disorder: Secondary | ICD-10-CM | POA: Diagnosis not present

## 2023-05-26 ENCOUNTER — Other Ambulatory Visit: Payer: Self-pay | Admitting: Family Medicine

## 2023-05-26 DIAGNOSIS — N529 Male erectile dysfunction, unspecified: Secondary | ICD-10-CM

## 2023-06-06 DIAGNOSIS — F1020 Alcohol dependence, uncomplicated: Secondary | ICD-10-CM | POA: Diagnosis not present

## 2023-06-06 DIAGNOSIS — F411 Generalized anxiety disorder: Secondary | ICD-10-CM | POA: Diagnosis not present

## 2023-06-06 DIAGNOSIS — F3181 Bipolar II disorder: Secondary | ICD-10-CM | POA: Diagnosis not present

## 2023-06-07 DIAGNOSIS — F1020 Alcohol dependence, uncomplicated: Secondary | ICD-10-CM | POA: Diagnosis not present

## 2023-06-07 DIAGNOSIS — F3181 Bipolar II disorder: Secondary | ICD-10-CM | POA: Diagnosis not present

## 2023-06-07 DIAGNOSIS — F411 Generalized anxiety disorder: Secondary | ICD-10-CM | POA: Diagnosis not present

## 2023-06-07 IMAGING — CT CT ABD-PELV W/ CM
2 of 5 series · 17 of 46 positions shown, 19 images · IV contrast (APPLIED)
Comparison: None.

CLINICAL DATA: Right lower quadrant abdominal pain. Appendicitis
suspected.

EXAM:
CT ABDOMEN AND PELVIS WITH CONTRAST
TECHNIQUE: Multidetector CT imaging of the abdomen and pelvis was performed
using the standard protocol following bolus administration of
intravenous contrast.
CONTRAST:  80mL OMNIPAQUE IOHEXOL 350 MG/ML SOLN

[Series 2: abd pel w · axial · 0.83mm/px · z∈[+1004,+1429]mm · 14 of 95 slices shown, 16 images]
[im 5/95  soft-tissue]
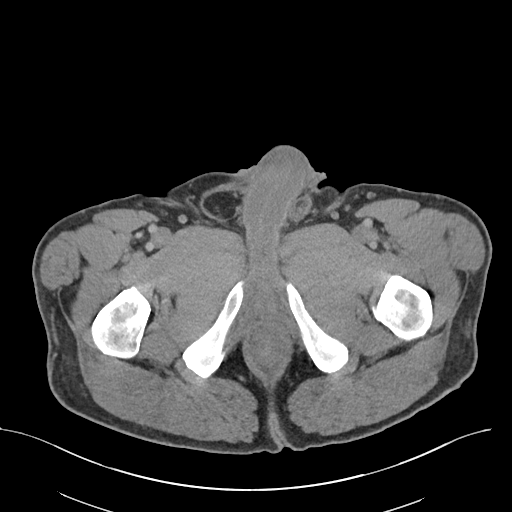
[im 5/95  bone]
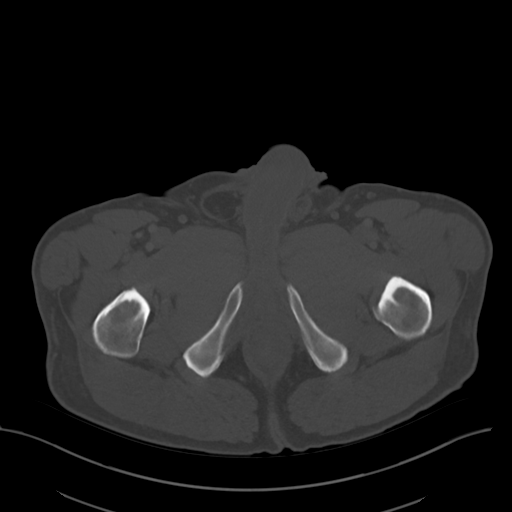
[im 10/95  soft-tissue]
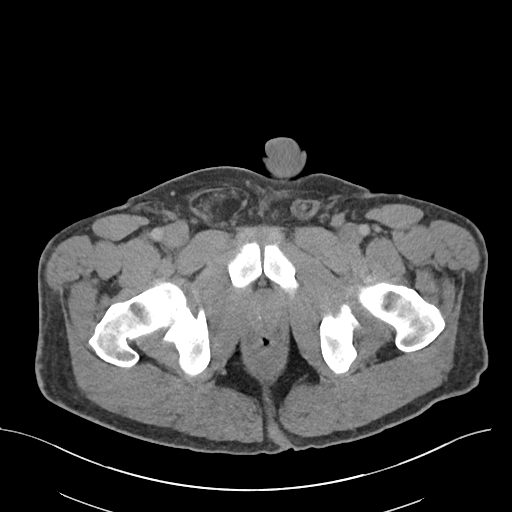
[im 20/95  soft-tissue]
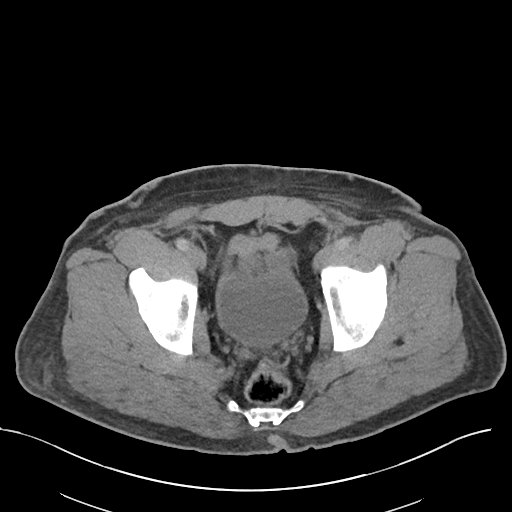
[im 25/95  soft-tissue]
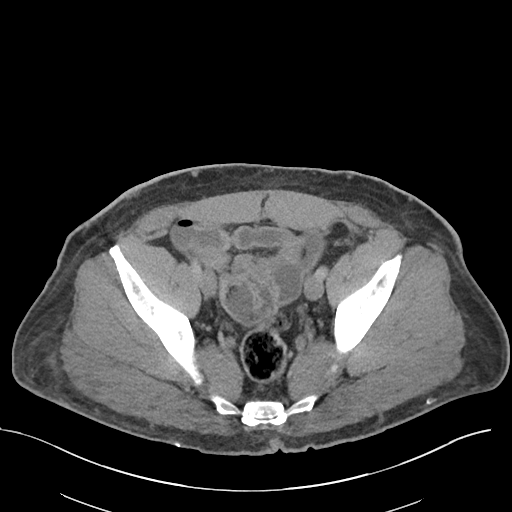
[im 30/95  soft-tissue]
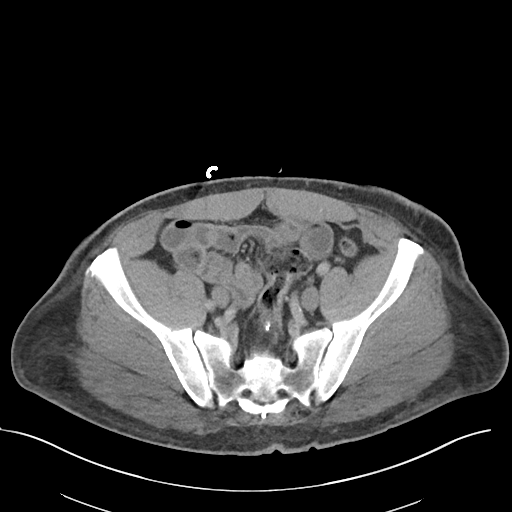
[im 40/95  soft-tissue]
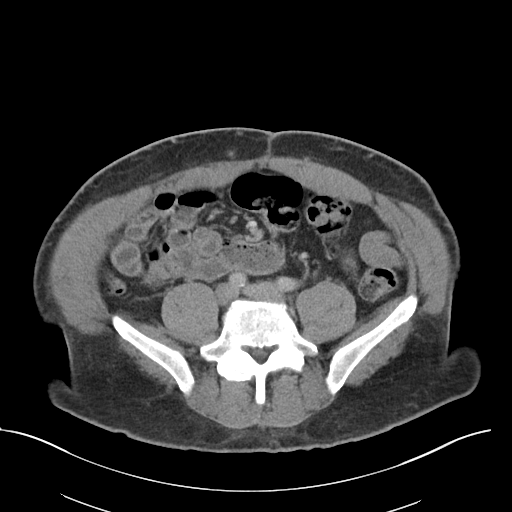
[im 45/95  soft-tissue]
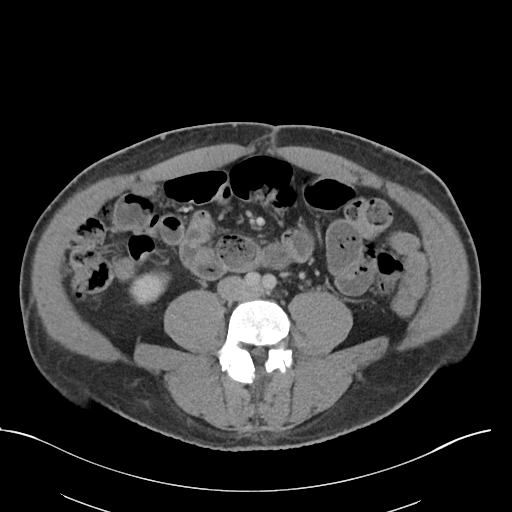
[im 50/95  soft-tissue]
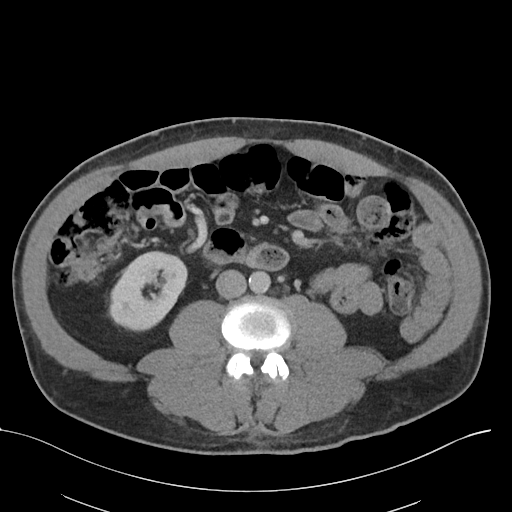
[im 55/95  soft-tissue]
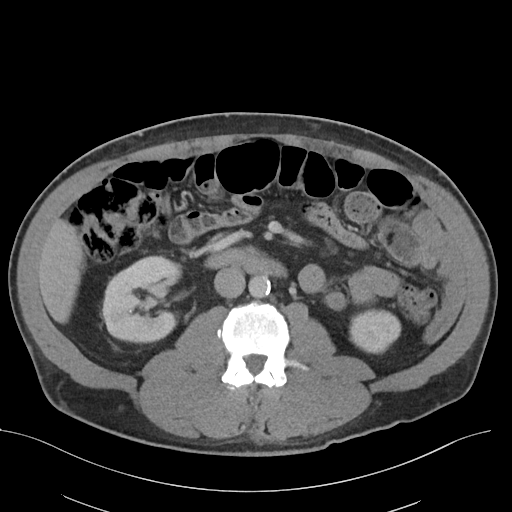
[im 55/95  bone]
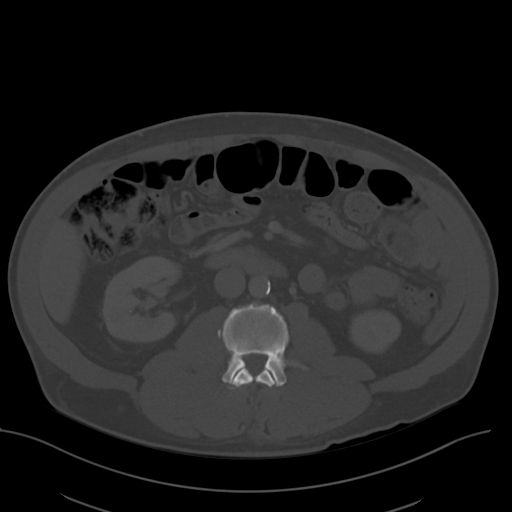
[im 65/95  soft-tissue]
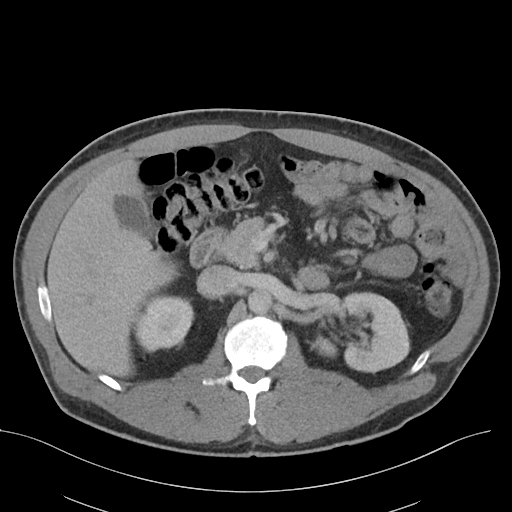
[im 70/95  soft-tissue]
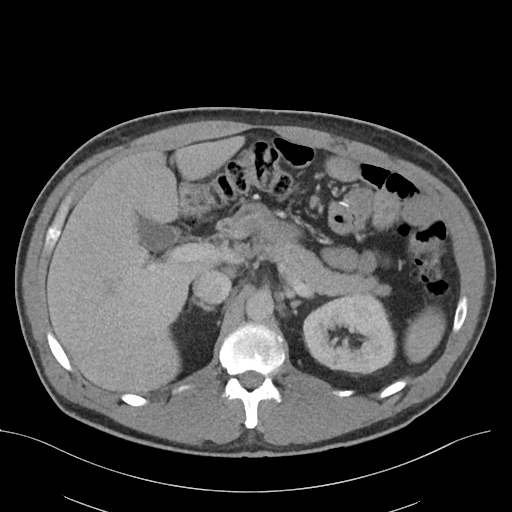
[im 75/95  soft-tissue]
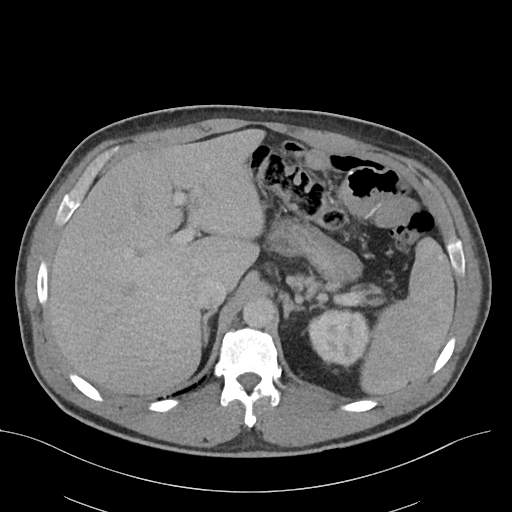
[im 85/95  soft-tissue]
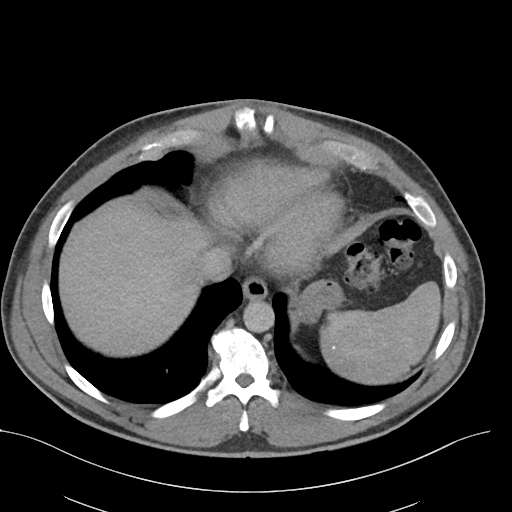
[im 90/95  soft-tissue]
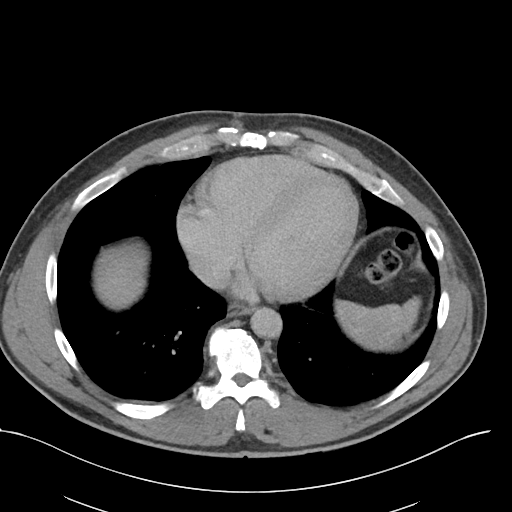

[Series 5: coronal · coronal · 0.89mm/px · 3 of 101 slices shown]
[im 34/101  soft-tissue]
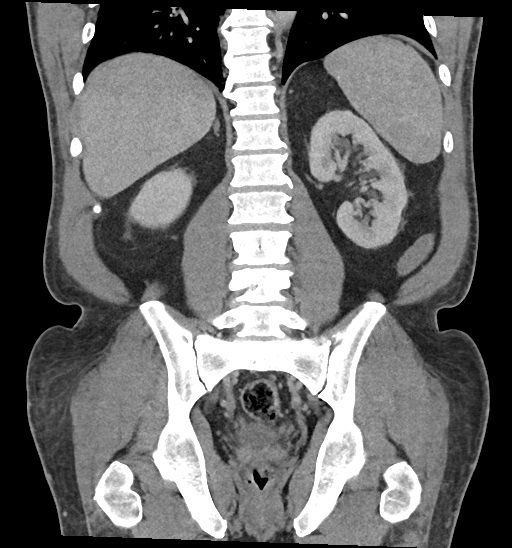
[im 45/101  soft-tissue]
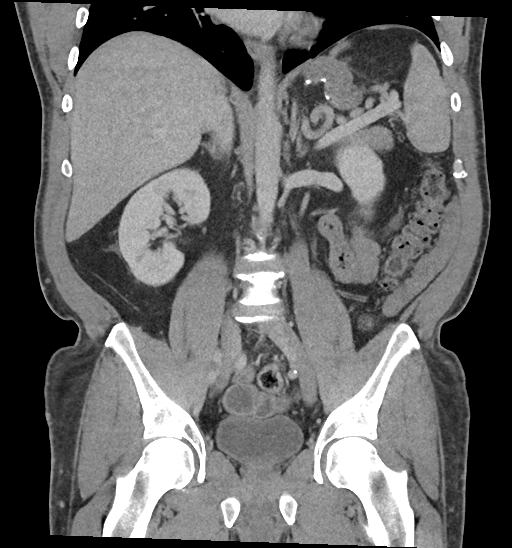
[im 56/101  soft-tissue]
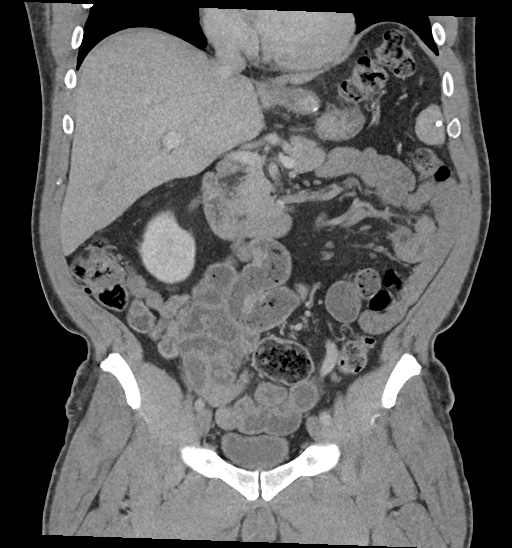

[17 of 46 positions shown; findings below may reference images not displayed]

FINDINGS: Lower chest: Negative

Hepatobiliary: Liver parenchyma appears normal. Single small
gallstone or calcified polyp in the gallbladder. No ductal
dilatation.

Pancreas: Normal

Spleen: Normal

Adrenals/Urinary Tract: Adrenal glands are normal. Few punctate
renal calculi on the right. No evidence of hydronephrosis or passing
stone. No stone in the bladder.

Stomach/Bowel: Previous gastric bypass surgery. No sign of bowel
obstruction. Normal appendix. No acute pathology elsewhere affecting
the colon.

Vascular/Lymphatic: Aortic atherosclerosis. No aneurysm. IVC is
normal. No retroperitoneal adenopathy.

Reproductive: Normal

Other: No free fluid or air.

Musculoskeletal: Previous lumbar decompression. Chronic degenerative
changes in the lumbar region. Inguinal hernia on the right
containing fat.
IMPRESSION: Normal appearance of the appendix.

Previous gastric bypass.  No acute bowel pathology is seen.

Right inguinal hernia containing fat.

Single small gallstone or calcified polyp within the gallbladder.

## 2023-06-09 ENCOUNTER — Ambulatory Visit: Payer: BC Managed Care – PPO | Admitting: Family Medicine

## 2023-06-14 ENCOUNTER — Ambulatory Visit
Admission: RE | Admit: 2023-06-14 | Discharge: 2023-06-14 | Disposition: A | Discharge status: Home / Self Care | Payer: BC Managed Care – PPO | Source: Ambulatory Visit | Attending: Emergency Medicine | Admitting: Emergency Medicine

## 2023-06-14 ENCOUNTER — Other Ambulatory Visit: Payer: Self-pay

## 2023-06-14 ENCOUNTER — Ambulatory Visit (INDEPENDENT_AMBULATORY_CARE_PROVIDER_SITE_OTHER): Payer: BC Managed Care – PPO

## 2023-06-14 VITALS — BP 159/106 | HR 70 | Temp 97.7°F | Resp 16

## 2023-06-14 DIAGNOSIS — R051 Acute cough: Secondary | ICD-10-CM

## 2023-06-14 DIAGNOSIS — R0602 Shortness of breath: Secondary | ICD-10-CM | POA: Diagnosis not present

## 2023-06-14 DIAGNOSIS — R059 Cough, unspecified: Secondary | ICD-10-CM | POA: Diagnosis not present

## 2023-06-14 MED ORDER — METHYLPREDNISOLONE ACETATE 40 MG/ML IJ SUSP: 40.0000 mg | Freq: Once | INTRAMUSCULAR | Status: DC

## 2023-06-14 MED ORDER — BENZONATATE 100 MG PO CAPS: 100.0000 mg | ORAL_CAPSULE | Freq: Three times a day (TID) | ORAL | 0 refills | Status: DC | PRN

## 2023-06-14 MED ORDER — ALBUTEROL SULFATE (2.5 MG/3ML) 0.083% IN NEBU
2.5000 mg | INHALATION_SOLUTION | Freq: Once | RESPIRATORY_TRACT | Status: AC
  Administered 2023-06-14: 2.5 mg via RESPIRATORY_TRACT

## 2023-06-14 MED ORDER — METHYLPREDNISOLONE ACETATE 80 MG/ML IJ SUSP
40.0000 mg | Freq: Once | INTRAMUSCULAR | Status: AC
  Administered 2023-06-14: 40 mg via INTRAMUSCULAR

## 2023-06-14 MED ORDER — ALBUTEROL SULFATE HFA 108 (90 BASE) MCG/ACT IN AERS: 2.0000 | INHALATION_SPRAY | Freq: Four times a day (QID) | RESPIRATORY_TRACT | 1 refills | Status: AC | PRN

## 2023-06-14 MED ORDER — AZITHROMYCIN 250 MG PO TABS: ORAL_TABLET | ORAL | 0 refills | Status: DC

## 2023-06-14 NOTE — Discharge Instructions (Addendum)
Please use the inhaler 3 times daily (every 6 hours) for the next several days. Then continue as needed.  Azithromycin as prescribed   The tessalon cough pills can be taken 3x daily. If this medication makes you drowsy, take only one pill before bed.

## 2023-06-14 NOTE — ED Provider Notes (Signed)
[facility]    CSN: [id number] Arrival date & time: 06/14/23  1248      History   Chief Complaint Chief Complaint  Patient presents with   Cough    Entered by patient    HPI Aaron Lozano is a 53 y.o. male.  About 10-day history of dry cough, chest tightness, shortness of breath He started out with nasal congestion and drainage; PCP had put him on 5 day course of amox about a week ago Now symptoms all in the chest No known fevers Has been using mucinex without change  He reports having pneumonia 3 times in the past and this is very similar Several sick contacts reported  No recent travel  Past Medical History:  Diagnosis Date   Arthritis    Bipolar 1 disorder, depressed (HCC)    ED (erectile dysfunction)    Hypertension    No longer on medication after gastric bypass surgery   Inguinal hernia    Right   Nausea vomiting and diarrhea 06/15/2022   Pneumonia    x2   Umbilical hernia     Patient Active Problem List   Diagnosis Date Noted   Testosterone deficiency in male 03/21/2023   Nicotine dependence due to vaping tobacco product 03/21/2023   Bipolar II disorder (HCC) 11/18/2022   Generalized anxiety disorder 11/18/2022   Anxiety 08/17/2022   Physical exam, annual 06/15/2022   Nausea vomiting and diarrhea 06/15/2022   Hypertension 09/15/2021   Bipolar 1 disorder, depressed (HCC) 09/15/2021   Erectile dysfunction 09/15/2021   Inguinal hernia, right 09/15/2021   Current every day vaping 09/15/2021   Postoperative intestinal obstruction 01/30/2019   Morbid obesity (HCC) 01/23/2019   Bipolar disorder, in partial remission, most recent episode depressed (HCC) 09/28/2018   Back pain 06/26/2018   Depression 06/26/2018   GERD (gastroesophageal reflux disease) 06/26/2018   IBS (irritable bowel syndrome) 06/26/2018   Sleep apnea 06/26/2018    Past Surgical History:  Procedure Laterality Date   ANKLE SURGERY     BACK SURGERY     x2   EXCISION  METACARPAL MASS Right 08/10/2021   Procedure: RIGHT INDEX FINGER MASS EXCISION;  Surgeon: Mack Hook, MD;  Location: [facility];  Service: Orthopedics;  Laterality: Right;  PRE-OP BLOCK LENGHT OF SURGERY: 60 MINUTES   GASTRIC BYPASS     INGUINAL HERNIA REPAIR Left 1988   INGUINAL HERNIA REPAIR  2006   INGUINAL HERNIA REPAIR N/A 10/05/2021   Procedure: LAPAROSCOPIC RIGHT INGUINAL HERNIA REPAIR WITH MESH;  Surgeon: Stechschulte, Hyman Hopes, MD;  Location: [facility];  Service: General;  Laterality: N/A;   right bicep reattached     SHOULDER SURGERY     x2   UMBILICAL HERNIA REPAIR N/A 10/05/2021   Procedure: UMBILICAL HERNIA REPAIR WITH MESH;  Surgeon: Stechschulte, Hyman Hopes, MD;  Location: [facility];  Service: General;  Laterality: N/A;       Home Medications    Prior to Admission medications   Medication Sig Start Date End Date Taking? Authorizing Provider  albuterol (VENTOLIN HFA) 108 (90 Base) MCG/ACT inhaler Inhale 2 puffs into the lungs every 6 (six) hours as needed for wheezing or shortness of breath. 06/14/23  Yes Admiral Marcucci, Lurena Joiner, PA-C  azithromycin (ZITHROMAX) 250 MG tablet Take 2 tablets together on day 1, then take 1 tablet daily for 4 days 06/14/23  Yes Breven Guidroz, Lurena Joiner, PA-C  benzonatate (TESSALON) 100 MG capsule Take 1 capsule (100 mg total) by mouth 3 (three) times  daily as needed for cough. 06/14/23  Yes Solangel Mcmanaway, Lurena Joiner, PA-C  buPROPion (WELLBUTRIN XL) 300 MG 24 hr tablet Take 1 tablet (300 mg total) by mouth daily. 03/21/23  Yes Kurtis Bushman S, FNP  cetirizine (ZYRTEC) 10 MG tablet Take 10 mg by mouth daily.   Yes [provider]  lisinopril (ZESTRIL) 10 MG tablet TAKE 1 TABLET BY MOUTH EVERY DAY 04/18/23  Yes Dimas Aguas, [facility] S, FNP  ondansetron (ZOFRAN-ODT) 4 MG disintegrating tablet TAKE 1 TABLET BY MOUTH EVERY 8 HOURS AS NEEDED FOR NAUSEA AND VOMITING 01/21/23  Yes Kurtis Bushman S, FNP  Oxcarbazepine (TRILEPTAL) 300 MG tablet Take 300 mg by mouth daily.   Yes  [provider]  sildenafil (REVATIO) 20 MG tablet TAKE 5 TABLETS BY MOUTH EVERY OTHER DAY AS NEEDED 1 HOUR PRIOR TO SEXUAL ACTIVITY 05/26/23  Yes Park Meo, FNP  dicyclomine (BENTYL) 20 MG tablet TAKE 1 TABLET BY MOUTH TWICE A DAY 01/25/23   Park Meo, FNP  hydrochlorothiazide (HYDRODIURIL) 25 MG tablet Take 1 tablet (25 mg total) by mouth daily. 07/15/22   Park Meo, FNP    Family History Family History  Problem Relation Age of Onset   Stroke Mother    Heart attack Father    Colon cancer Maternal Grandmother    Lung cancer Neg Hx    Prostate cancer Neg Hx     Social History Social History   Tobacco Use   Smoking status: Never   Smokeless tobacco: Never  Vaping Use   Vaping status: Some Days  Substance Use Topics   Alcohol use: Not Currently    Comment: 1-2 on weekends   Drug use: Never     Allergies   Patient has no known allergies.   Review of Systems Review of Systems  Respiratory:  Positive for cough.    Per HPI  Physical Exam Triage Vital Signs ED Triage Vitals  Encounter Vitals Group     BP 06/14/23 1258 (!) 169/116     Systolic BP Percentile --      Diastolic BP Percentile --      Pulse Rate 06/14/23 1258 84     Resp 06/14/23 1258 16     Temp 06/14/23 1258 97.7 F (36.5 C)     Temp Source 06/14/23 1258 Oral     SpO2 06/14/23 1258 95 %     Weight --      Height --      Head Circumference --      Peak Flow --      Pain Score 06/14/23 1303 2     Pain Loc --      Pain Education --      Exclude from Growth Chart --    No data found.  Updated Vital Signs BP (!) 159/106 (BP Location: Left Arm)   Pulse 70   Temp 97.7 F (36.5 C) (Oral)   Resp 16   SpO2 94%    Physical Exam Vitals and nursing note reviewed.  Constitutional:      General: He is not in acute distress. HENT:     Right Ear: Tympanic membrane and ear canal normal.     Left Ear: Tympanic membrane and ear canal normal.     Nose: No rhinorrhea.      Mouth/Throat:     Mouth: Mucous membranes are moist.     Pharynx: Oropharynx is clear. No posterior oropharyngeal erythema.  Eyes:     Conjunctiva/sclera: Conjunctivae normal.  Cardiovascular:     Rate and Rhythm: Normal rate and regular rhythm.     Heart sounds: Normal heart sounds.  Pulmonary:     Effort: Pulmonary effort is normal. No respiratory distress.     Breath sounds: No wheezing.     Comments: Decreased sounds RLL, coarse middle lobe Musculoskeletal:     Cervical back: Normal range of motion.  Lymphadenopathy:     Cervical: No cervical adenopathy.  Skin:    General: Skin is warm and dry.  Neurological:     Mental Status: He is alert and oriented to person, place, and time.      UC Treatments / Results  Labs (all labs ordered are listed, but only abnormal results are displayed) Labs Reviewed - No data to display  EKG   Radiology DG Chest 2 View Result Date: 06/14/2023 CLINICAL DATA:  Shortness of breath and cough. EXAM: CHEST - 2 VIEW COMPARISON:  None Available. FINDINGS: The heart size and mediastinal contours are within normal limits. Both lungs are clear. The visualized skeletal structures are unremarkable. IMPRESSION: No active cardiopulmonary disease. Electronically Signed   By: Elgie Collard M.D.   On: 06/14/2023 13:51    Procedures Procedures (including critical care time)  Medications Ordered in UC Medications  methylPREDNISolone acetate (DEPO-MEDROL) injection 40 mg (has no administration in time range)  albuterol (PROVENTIL) (2.5 MG/3ML) 0.083% nebulizer solution 2.5 mg (2.5 mg Nebulization Given 06/14/23 1339)    Initial Impression / Assessment and Plan / UC Course  I have reviewed the triage vital signs and the nursing notes.  Pertinent labs & imaging results that were available during my care of the patient were reviewed by me and considered in my medical decision making (see chart for details).  Afebrile in clinic. Sating 95% room  air Hypertensive although likely from shortness of breath with cough. No red flags  Albuterol nebulizer given for shortness of breath and chest tightness. Patient not improved. IM DepoMedrol is given   Chest xray without signs of pneumonia With patient history, symptoms, and lung sounds on exam, I have sent azithromycin to cover for atypical infection that the previous amoxicillin wouldn't have treated. Sent tessalon to use TID and albuterol inhaler Return precautions discussed, patient is agreeable to plan, all questions answered  Final Clinical Impressions(s) / UC Diagnoses   Final diagnoses:  Acute cough     Discharge Instructions      Please use the inhaler 3 times daily (every 6 hours) for the next several days. Then continue as needed.  Azithromycin as prescribed   The tessalon cough pills can be taken 3x daily. If this medication makes you drowsy, take only one pill before bed.      ED Prescriptions     Medication Sig Dispense Auth. Provider   albuterol (VENTOLIN HFA) 108 (90 Base) MCG/ACT inhaler Inhale 2 puffs into the lungs every 6 (six) hours as needed for wheezing or shortness of breath. 8 g Kali Ambler, PA-C   azithromycin (ZITHROMAX) 250 MG tablet Take 2 tablets together on day 1, then take 1 tablet daily for 4 days 6 tablet Rosamond Andress, PA-C   benzonatate (TESSALON) 100 MG capsule Take 1 capsule (100 mg total) by mouth 3 (three) times daily as needed for cough. 30 capsule Ozie Lupe, Lurena Joiner, PA-C      PDMP not reviewed this encounter.   Marlow Baars, [place] 06/14/23 1421

## 2023-06-14 NOTE — ED Triage Notes (Signed)
Patient C/O non productive cough, nasal congestion and body aches. Patient c/o shortness of breath and states it feels like he has "pneumonia again"

## 2023-06-23 ENCOUNTER — Other Ambulatory Visit: Payer: Self-pay | Admitting: Family Medicine

## 2023-06-23 DIAGNOSIS — N529 Male erectile dysfunction, unspecified: Secondary | ICD-10-CM

## 2023-07-03 ENCOUNTER — Other Ambulatory Visit (HOSPITAL_COMMUNITY)
Admission: EM | Admit: 2023-07-03 | Discharge: 2023-07-04 | Disposition: A | Discharge status: Nursing Home (Includes ICF) | Payer: Medicaid Other | Attending: Psychiatry | Admitting: Psychiatry

## 2023-07-03 DIAGNOSIS — F1010 Alcohol abuse, uncomplicated: Secondary | ICD-10-CM | POA: Diagnosis present

## 2023-07-03 DIAGNOSIS — F10130 Alcohol abuse with withdrawal, uncomplicated: Secondary | ICD-10-CM | POA: Insufficient documentation

## 2023-07-03 DIAGNOSIS — R4589 Other symptoms and signs involving emotional state: Secondary | ICD-10-CM | POA: Diagnosis not present

## 2023-07-03 DIAGNOSIS — Z91148 Patient's other noncompliance with medication regimen for other reason: Secondary | ICD-10-CM | POA: Insufficient documentation

## 2023-07-03 DIAGNOSIS — Z79899 Other long term (current) drug therapy: Secondary | ICD-10-CM | POA: Insufficient documentation

## 2023-07-03 LAB — ETHANOL: Alcohol, Ethyl (B): 49 mg/dL — ABNORMAL HIGH (ref <10)

## 2023-07-03 LAB — CBC WITH DIFFERENTIAL/PLATELET
Abs Immature Granulocytes: 0.01 10*3/uL (ref 0.00–0.07)
Basophils Absolute: 0.1 10*3/uL (ref 0.0–0.1)
Basophils Relative: 2 %
Eosinophils Absolute: 0.2 10*3/uL (ref 0.0–0.5)
Eosinophils Relative: 3 %
HCT: 47.4 % (ref 39.0–52.0)
Hemoglobin: 16.4 g/dL (ref 13.0–17.0)
Immature Granulocytes: 0 %
Lymphocytes Relative: 27 %
Lymphs Abs: 1.3 10*3/uL (ref 0.7–4.0)
MCH: 29.8 pg (ref 26.0–34.0)
MCHC: 34.6 g/dL (ref 30.0–36.0)
MCV: 86 fL (ref 80.0–100.0)
Monocytes Absolute: 0.4 10*3/uL (ref 0.1–1.0)
Monocytes Relative: 9 %
Neutro Abs: 2.8 10*3/uL (ref 1.7–7.7)
Neutrophils Relative %: 59 %
Platelets: 257 10*3/uL (ref 150–400)
RBC: 5.51 MIL/uL (ref 4.22–5.81)
RDW: 12.2 % (ref 11.5–15.5)
WBC: 4.7 10*3/uL (ref 4.0–10.5)
nRBC: 0 % (ref 0.0–0.2)

## 2023-07-03 LAB — COMPREHENSIVE METABOLIC PANEL
ALT: 37 U/L (ref 0–44)
AST: 34 U/L (ref 15–41)
Albumin: 3.9 g/dL (ref 3.5–5.0)
Alkaline Phosphatase: 48 U/L (ref 38–126)
Anion gap: 13 (ref 5–15)
BUN: 12 mg/dL (ref 6–20)
CO2: 21 mmol/L — ABNORMAL LOW (ref 22–32)
Calcium: 9 mg/dL (ref 8.9–10.3)
Chloride: 105 mmol/L (ref 98–111)
Creatinine, Ser: 0.93 mg/dL (ref 0.61–1.24)
GFR, Estimated: >60 mL/min (ref >60)
Glucose, Bld: 75 mg/dL (ref 70–99)
Potassium: 3.8 mmol/L (ref 3.5–5.1)
Sodium: 139 mmol/L (ref 135–145)
Total Bilirubin: 0.9 mg/dL (ref 0.0–1.2)
Total Protein: 6.1 g/dL — ABNORMAL LOW (ref 6.5–8.1)

## 2023-07-03 LAB — LIPID PANEL
Cholesterol: 191 mg/dL (ref 0–200)
HDL: 75 mg/dL (ref >40)
LDL Cholesterol: 70 mg/dL (ref 0–99)
Total CHOL/HDL Ratio: 2.5 {ratio}
Triglycerides: 231 mg/dL — ABNORMAL HIGH (ref <150)
VLDL: 46 mg/dL — ABNORMAL HIGH (ref 0–40)

## 2023-07-03 LAB — TSH: TSH: 4.431 u[IU]/mL (ref 0.350–4.500)

## 2023-07-03 MED ORDER — THIAMINE MONONITRATE 100 MG PO TABS: 100.0000 mg | ORAL_TABLET | Freq: Every day | ORAL | Status: DC

## 2023-07-03 MED ORDER — ONDANSETRON 4 MG PO TBDP
4.0000 mg | ORAL_TABLET | Freq: Four times a day (QID) | ORAL | Status: DC | PRN
  Administered 2023-07-03: 4 mg via ORAL
  Filled 2023-07-03: qty 1

## 2023-07-03 MED ORDER — MAGNESIUM HYDROXIDE 400 MG/5ML PO SUSP: 30.0000 mL | Freq: Every day | ORAL | Status: DC | PRN

## 2023-07-03 MED ORDER — TRAZODONE HCL 50 MG PO TABS
50.0000 mg | ORAL_TABLET | Freq: Every evening | ORAL | Status: DC | PRN
  Administered 2023-07-03: 50 mg via ORAL
  Filled 2023-07-03: qty 1

## 2023-07-03 MED ORDER — CLONIDINE HCL 0.1 MG PO TABS
0.1000 mg | ORAL_TABLET | Freq: Once | ORAL | Status: AC | PRN
  Administered 2023-07-03: 0.1 mg via ORAL
  Filled 2023-07-03: qty 1

## 2023-07-03 MED ORDER — LORAZEPAM 1 MG PO TABS
1.0000 mg | ORAL_TABLET | Freq: Four times a day (QID) | ORAL | Status: DC | PRN
  Administered 2023-07-03 – 2023-07-04 (×2): 1 mg via ORAL
  Filled 2023-07-03 (×2): qty 1

## 2023-07-03 MED ORDER — HYDROXYZINE HCL 25 MG PO TABS
25.0000 mg | ORAL_TABLET | Freq: Four times a day (QID) | ORAL | Status: DC | PRN
  Administered 2023-07-03: 25 mg via ORAL
  Filled 2023-07-03: qty 1

## 2023-07-03 MED ORDER — ADULT MULTIVITAMIN W/MINERALS CH: 1.0000 | ORAL_TABLET | Freq: Every day | ORAL | Status: DC

## 2023-07-03 MED ORDER — LOPERAMIDE HCL 2 MG PO CAPS: 2.0000 mg | ORAL_CAPSULE | ORAL | Status: DC | PRN

## 2023-07-03 MED ORDER — ACETAMINOPHEN 325 MG PO TABS
650.0000 mg | ORAL_TABLET | Freq: Four times a day (QID) | ORAL | Status: DC | PRN
  Administered 2023-07-04: 650 mg via ORAL
  Filled 2023-07-03: qty 2

## 2023-07-03 MED ORDER — ALUM & MAG HYDROXIDE-SIMETH 200-200-20 MG/5ML PO SUSP: 30.0000 mL | ORAL | Status: DC | PRN

## 2023-07-03 MED ORDER — THIAMINE HCL 100 MG/ML IJ SOLN
100.0000 mg | Freq: Once | INTRAMUSCULAR | Status: AC
  Administered 2023-07-03: 100 mg via INTRAMUSCULAR
  Filled 2023-07-03: qty 2

## 2023-07-03 NOTE — ED Notes (Signed)
 Pt presents to [facility] to get help with Alcohol abuse. During skin assessment, there is a scalp laceration from fall on the crown of the pt head, not bleeding with pain level of 2/10 according to pt, which pt doesn't want it to be dressed.Admission process completed. Pt is oriented to the unit and provided with meal. . Pt denies HI/AVH. Will continue to monitor for safety and provide support.

## 2023-07-03 NOTE — ED Notes (Signed)
 Patient verbalized complaint of anxiety 8/10, "shaking on the inside", headache, and nausea. Patient was given the following PRNs: -Clonidine  PO for elevated BP - Vistaril  and Ativan  PO for anxiety/withdrawal -Zofran  SL for nausea.

## 2023-07-04 ENCOUNTER — Other Ambulatory Visit (HOSPITAL_COMMUNITY)
Admission: EM | Admit: 2023-07-04 | Discharge: 2023-07-07 | Disposition: A | Discharge status: Home / Self Care | Payer: Medicaid Other | Source: Home / Self Care

## 2023-07-04 DIAGNOSIS — Z79899 Other long term (current) drug therapy: Secondary | ICD-10-CM | POA: Diagnosis not present

## 2023-07-04 DIAGNOSIS — F319 Bipolar disorder, unspecified: Secondary | ICD-10-CM | POA: Insufficient documentation

## 2023-07-04 DIAGNOSIS — W19XXXA Unspecified fall, initial encounter: Secondary | ICD-10-CM | POA: Insufficient documentation

## 2023-07-04 DIAGNOSIS — F10130 Alcohol abuse with withdrawal, uncomplicated: Secondary | ICD-10-CM | POA: Diagnosis not present

## 2023-07-04 DIAGNOSIS — Z9884 Bariatric surgery status: Secondary | ICD-10-CM | POA: Insufficient documentation

## 2023-07-04 DIAGNOSIS — Z91148 Patient's other noncompliance with medication regimen for other reason: Secondary | ICD-10-CM | POA: Diagnosis not present

## 2023-07-04 DIAGNOSIS — Y92009 Unspecified place in unspecified non-institutional (private) residence as the place of occurrence of the external cause: Secondary | ICD-10-CM | POA: Insufficient documentation

## 2023-07-04 DIAGNOSIS — R4589 Other symptoms and signs involving emotional state: Secondary | ICD-10-CM | POA: Diagnosis not present

## 2023-07-04 LAB — HEMOGLOBIN A1C
Hgb A1c MFr Bld: 4.4 % — ABNORMAL LOW (ref 4.8–5.6)
Mean Plasma Glucose: 79.58 mg/dL

## 2023-07-04 MED ORDER — HYDROXYZINE HCL 25 MG PO TABS
25.0000 mg | ORAL_TABLET | Freq: Four times a day (QID) | ORAL | Status: AC | PRN
  Administered 2023-07-04 – 2023-07-06 (×4): 25 mg via ORAL
  Filled 2023-07-04 (×4): qty 1

## 2023-07-04 MED ORDER — CLONIDINE HCL 0.1 MG PO TABS
0.1000 mg | ORAL_TABLET | Freq: Once | ORAL | Status: AC
  Administered 2023-07-04: 0.1 mg via ORAL
  Filled 2023-07-04: qty 1

## 2023-07-04 MED ORDER — TRAZODONE HCL 50 MG PO TABS
50.0000 mg | ORAL_TABLET | Freq: Every evening | ORAL | Status: DC | PRN
  Administered 2023-07-04 – 2023-07-06 (×3): 50 mg via ORAL
  Filled 2023-07-04 (×3): qty 1

## 2023-07-04 MED ORDER — ONDANSETRON 4 MG PO TBDP
4.0000 mg | ORAL_TABLET | Freq: Four times a day (QID) | ORAL | Status: AC | PRN
  Administered 2023-07-04 – 2023-07-05 (×2): 4 mg via ORAL
  Filled 2023-07-04 (×2): qty 1

## 2023-07-04 MED ORDER — LORAZEPAM 1 MG PO TABS
1.0000 mg | ORAL_TABLET | Freq: Four times a day (QID) | ORAL | Status: DC | PRN
  Administered 2023-07-04: 1 mg via ORAL
  Filled 2023-07-04: qty 1

## 2023-07-04 MED ORDER — ENSURE ENLIVE PO LIQD
237.0000 mL | Freq: Two times a day (BID) | ORAL | Status: DC
  Administered 2023-07-05 – 2023-07-06 (×4): 237 mL via ORAL

## 2023-07-04 MED ORDER — CLONIDINE HCL 0.1 MG PO TABS
0.1000 mg | ORAL_TABLET | Freq: Four times a day (QID) | ORAL | Status: DC | PRN
  Administered 2023-07-05 – 2023-07-06 (×2): 0.1 mg via ORAL
  Filled 2023-07-04 (×3): qty 1

## 2023-07-04 MED ORDER — ACETAMINOPHEN 325 MG PO TABS
650.0000 mg | ORAL_TABLET | Freq: Four times a day (QID) | ORAL | Status: DC | PRN
  Administered 2023-07-05 – 2023-07-07 (×3): 650 mg via ORAL
  Filled 2023-07-04 (×3): qty 2

## 2023-07-04 MED ORDER — ADULT MULTIVITAMIN W/MINERALS CH
1.0000 | ORAL_TABLET | Freq: Every day | ORAL | Status: DC
  Administered 2023-07-04 – 2023-07-07 (×4): 1 via ORAL
  Filled 2023-07-04 (×4): qty 1

## 2023-07-04 MED ORDER — BUPROPION HCL ER (XL) 150 MG PO TB24
150.0000 mg | ORAL_TABLET | Freq: Every day | ORAL | Status: DC
  Administered 2023-07-06 – 2023-07-07 (×2): 150 mg via ORAL
  Filled 2023-07-04 (×2): qty 1

## 2023-07-04 MED ORDER — LORAZEPAM 1 MG PO TABS
1.0000 mg | ORAL_TABLET | Freq: Four times a day (QID) | ORAL | Status: DC | PRN
  Administered 2023-07-04 – 2023-07-05 (×2): 1 mg via ORAL
  Filled 2023-07-04 (×2): qty 1

## 2023-07-04 MED ORDER — MAGNESIUM HYDROXIDE 400 MG/5ML PO SUSP: 30.0000 mL | Freq: Every day | ORAL | Status: DC | PRN

## 2023-07-04 MED ORDER — OXCARBAZEPINE 150 MG PO TABS
150.0000 mg | ORAL_TABLET | Freq: Two times a day (BID) | ORAL | Status: DC
  Administered 2023-07-04 – 2023-07-07 (×7): 150 mg via ORAL
  Filled 2023-07-04 (×7): qty 1

## 2023-07-04 MED ORDER — BUPROPION HCL 75 MG PO TABS
75.0000 mg | ORAL_TABLET | Freq: Every day | ORAL | Status: AC
  Administered 2023-07-04 – 2023-07-05 (×2): 75 mg via ORAL
  Filled 2023-07-04: qty 1

## 2023-07-04 MED ORDER — TRIPLE ANTIBIOTIC 3.5-400-5000 EX OINT
1.0000 | TOPICAL_OINTMENT | Freq: Every day | CUTANEOUS | Status: DC | PRN
  Administered 2023-07-04: 1 via TOPICAL
  Filled 2023-07-04: qty 1

## 2023-07-04 MED ORDER — LOPERAMIDE HCL 2 MG PO CAPS: 2.0000 mg | ORAL_CAPSULE | ORAL | Status: AC | PRN

## 2023-07-04 MED ORDER — AMLODIPINE BESYLATE 5 MG PO TABS: 5.0000 mg | ORAL_TABLET | Freq: Every day | ORAL | Status: DC

## 2023-07-04 MED ORDER — THIAMINE MONONITRATE 100 MG PO TABS
100.0000 mg | ORAL_TABLET | Freq: Every day | ORAL | Status: DC
  Administered 2023-07-04 – 2023-07-07 (×5): 100 mg via ORAL
  Filled 2023-07-04 (×4): qty 1

## 2023-07-04 MED ORDER — ALUM & MAG HYDROXIDE-SIMETH 200-200-20 MG/5ML PO SUSP: 30.0000 mL | ORAL | Status: DC | PRN

## 2023-07-04 MED ORDER — NICOTINE 21 MG/24HR TD PT24
21.0000 mg | MEDICATED_PATCH | Freq: Every day | TRANSDERMAL | Status: DC
  Administered 2023-07-04 – 2023-07-07 (×4): 21 mg via TRANSDERMAL
  Filled 2023-07-04 (×4): qty 1

## 2023-07-04 MED ORDER — CLONIDINE HCL 0.1 MG PO TABS
0.1000 mg | ORAL_TABLET | Freq: Once | ORAL | Status: AC | PRN
  Administered 2023-07-04: 0.1 mg via ORAL
  Filled 2023-07-04: qty 1

## 2023-07-04 NOTE — ED Notes (Signed)
 Pt Ciwa remains high at 15, [facility] Home, NP ok to give prn ativan  1mg  early" .

## 2023-07-04 NOTE — Group Note (Signed)
 Group Topic: Change and Accountability  Group Date: 07/04/2023 Start Time: 1000 End Time: 1100 Facilitators: Elfredia Grippe, NT; Rollin Clock  Department: [facility]  Number of Participants: 9  Group Focus: abuse issues Treatment Modality:  Psychoeducation Interventions utilized were problem solving Purpose: enhance coping skills, express feelings, increase insight, and regain self-worth  Name: Aaron Lozano Date of Birth: 11/03/1970  MR: [id number]    Level of Participation: active Quality of Participation: attentive Interactions with others: gave feedback Mood/Affect: appropriate Triggers (if applicable): N/A Cognition: coherent/clear Progress: Moderate Response: N/A Plan: follow-up needed  Patients Problems:  Patient Active Problem List   Diagnosis Date Noted   Alcohol abuse 07/03/2023   Testosterone  deficiency in male 03/21/2023   Nicotine  dependence due to vaping tobacco product 03/21/2023   Bipolar II disorder (HCC) 11/18/2022   Generalized anxiety disorder 11/18/2022   Anxiety 08/17/2022   Physical exam, annual 06/15/2022   Nausea vomiting and diarrhea 06/15/2022   Hypertension 09/15/2021   Bipolar 1 disorder, depressed (HCC) 09/15/2021   Erectile dysfunction 09/15/2021   Inguinal hernia, right 09/15/2021   Current every day vaping 09/15/2021   Postoperative intestinal obstruction 01/30/2019   Morbid obesity (HCC) 01/23/2019   Bipolar disorder, in partial remission, most recent episode depressed (HCC) 09/28/2018   Back pain 06/26/2018   Depression 06/26/2018   GERD (gastroesophageal reflux disease) 06/26/2018   IBS (irritable bowel syndrome) 06/26/2018   Sleep apnea 06/26/2018

## 2023-07-04 NOTE — ED Notes (Signed)
 Patient b/p 141/91 79.  Asymptomatic.  MD notified.

## 2023-07-04 NOTE — ED Notes (Signed)
 Patient has improved significantly.  He is no longer pale and he was able to take a shower.  He reports he is no longer dizzy and CIWA is 7.  Patient has mild tremors.  Vs = 158/ 105 76.  MD made aware and awaiting orders.

## 2023-07-04 NOTE — ED Provider Notes (Addendum)
 Behavioral Health Urgent Care Medical Screening Exam  Patient Name: Aaron Lozano MRN: [id number] Date of Evaluation: 07/04/23 Chief Complaint:   Diagnosis:  Final diagnoses:  Alcohol abuse    History of Present illness: Aaron Lozano is a 53 y.o. male with psychiatric history significant for alcohol abuse and bipolar disorder.  Presented voluntarily to [facility] reporting depressive symptoms and requesting alcohol detox.  Patient is accompanied by his girlfriend, Morrell Riddle.  Patient gave verbal consent for his girlfriend to remain present during assessment.  Patient was evaluated face-to-face and his chart was reviewed by this nurse practitioner. Patient reports struggling with alcohol consumption for the past 3 years. He acknowledges an increase in alcohol intake over the past 3 months, which he attributes to increased stress related to work and relationship difficulties. The patient reports drinking 2-3 cans of 24 oz beer daily, with recent consumption of 1/5 of liquor yesterday. He states that he has been feeling increasingly depressed, endorsing depressive symptoms of persistent sadness and feelings of hopelessness, loss of interest in activities he once enjoyed, fatigue, difficulty concentrating, irritability, and sleep disturbances (insomnia).  The patient also reports being diagnosed with bipolar disorder but has not been taking his prescribed medications for several months.he says he has not taken Trileptal in over 1 month and has not taken Depakote and Wellbutrin in 6-9 months. He acknowledges the negative impact his drinking and non-compliance with medication have had on his mental health. He expresses uncertainty about how to reduce or stop drinking. The symptoms of depression have significantly affected his daily functioning, and alcohol use has become a way to cope with his stress and emotional instability. He reports passive suicidal ideation without plan or intent. He denies history of  suicidal attempt. He denies hallucinations, paranoia, and all other substance abuse.   BAL: 49,   Patient is alert and oriented x4. He appears appropriately dressed. His speech is slow and low in volume, with a flat affect. He reports his mood as "depressed,". Thought processes are generally logical and coherent. There are no indications of delusions or hallucinations. The patient's insight is intact. Memory and concentration appear intact .He denies suicidal or homicidal ideation.  This provider discussed admission to [facility] for alcohol detox and mood stabilization with patient. Patient is in agreement with admission to [facility].     Flowsheet Row ED from 07/04/2023 in [facility] ED from 07/03/2023 in [facility] ED from 06/14/2023 in [facility] at [facility]  C-SSRS RISK CATEGORY Low Risk Low Risk No Risk       Psychiatric Specialty Exam  Presentation  General Appearance:No data recorded Eye Contact:No data recorded Speech:No data recorded Speech Volume:No data recorded Handedness:No data recorded  Mood and Affect  Mood: Depressed  Affect: Appropriate   Thought Process  Thought Processes: Coherent  Descriptions of Associations:Intact  Orientation:Full (Time, Place and Person)  Thought Content:WDL    Hallucinations:None  Ideas of Reference:None  Suicidal Thoughts:Yes, Passive Without Plan; Without Intent  Homicidal Thoughts:No   Sensorium  Memory: Immediate Good; Recent Good; Remote Good  Judgment: Fair  Insight: Good   Executive Functions  Concentration: Good  Attention Span: Good  Recall: Good  Fund of Knowledge: Good  Language: Good   Psychomotor Activity  Psychomotor Activity: Normal   Assets  Assets: Communication Skills; Desire for Improvement; Housing; Social Support   Sleep  Sleep: Poor  Number of hours:  2   Physical Exam: Physical  Exam Vitals and nursing note reviewed.  Constitutional:      General: He is not in acute distress.    Appearance: He is well-developed. He is not ill-appearing, toxic-appearing or diaphoretic.  HENT:     Head: Normocephalic.  Eyes:     Conjunctiva/sclera: Conjunctivae normal.  Cardiovascular:     Rate and Rhythm: Normal rate.  Pulmonary:     Effort: Pulmonary effort is normal.  Musculoskeletal:        General: Normal range of motion.     Cervical back: Normal range of motion.  Skin:    General: Skin is warm.  Neurological:     Mental Status: He is alert and oriented to person, place, and time.  Psychiatric:        Attention and Perception: Attention and perception normal.        Mood and Affect: Mood normal.        Speech: Speech normal.        Behavior: Behavior normal. Behavior is cooperative.        Thought Content: Thought content includes suicidal ideation.    Review of Systems  Constitutional: Negative.   HENT: Negative.    Eyes: Negative.   Respiratory: Negative.    Cardiovascular: Negative.   Gastrointestinal: Negative.   Genitourinary: Negative.   Musculoskeletal: Negative.   Skin: Negative.   Neurological: Negative.   Endo/Heme/Allergies: Negative.   Psychiatric/Behavioral:  Positive for depression and substance abuse. The patient is nervous/anxious.    Blood pressure (!) 156/100, pulse 88, temperature 98.8 F (37.1 C), temperature source Oral, resp. rate 16, SpO2 97%. There is no height or weight on file to calculate BMI.  Musculoskeletal: Strength & Muscle Tone: within normal limits Gait & Station: normal Patient leans: Right   [facility] MSE Discharge Disposition for Follow up and Recommendations: Patient will be admitted to [facility] for stabilization, alcohol detox, and substance abuse treatment. -Will initiate CIWA protocol upon admission to [facility] -Labs: cmp, cbc, lipids, hgb A1C, EtoH, valopric acid level, Carbamazipine level  Estes Lehner A Andros Channing, NP 07/04/2023,  4:04 AM

## 2023-07-04 NOTE — ED Notes (Signed)
 Patient appeared pale and reported feeling dizzy and nauseous.  Patient escorted to his room and b/p taken 174/102 83 100%r/a.  Dr Deborah Falling made aware and patient given 0.1mg  clonidine  and 1mg  ativan  PO PRN stat.  As per Dr Pennye Boy instructions.  Patient was also given Gatorade and instructed to remain in bed.  Patient to be monitored closely and b/p retaken in 1 hour.  Ciwa= 10

## 2023-07-04 NOTE — ED Provider Notes (Signed)
 Facility Based Crisis Admission H&P  Date: 07/04/23 Patient Name: Aaron Lozano MRN: [id number] Chief Complaint: I need help for my depression and alcohol abuse.  Diagnoses:  Final diagnoses:  None    HPI: The patient is a 53 year old male with a past history of alcohol abuse and bipolar disorder who presented himself voluntarily reporting increasing depression and increased alcohol use.  Apparently recently lost his job and has been drinking heavily.  He states that he has been struggling with alcohol consumption for over 3 years but recently escalated the use after he lost his job for the last 3 months.  He attributes increased stress at work and relationship difficulties.  He reports that he has been drinking at least 3 cans of 24 ounce, 9.5% beer every day but over the last 4 or 5 days she is escalated and has been drinking anything and everything he can get his hands on.  He apparently fell at home on Saturday before admission because he was intoxicated and does not know what happened. Patient also reports that he has a long history of bipolar disorder and has not been taking his prescribed medication for several months and has stopped taking his Trileptal  in over a month and not taken Depakote or Wellbutrin  for several months.  He would like to restart his medications if possible.  Today he denied any active SI/HI/AVH and denied any passive thoughts. When seen today patient was alert and oriented cooperative and pleasant.  He maintained fair to good eye contact.  He continues to endorse difficulties with his depression and relationship and the fact that he recently lost his job.  He wants to be detoxed and wants help post detox with rehab.  He is contracting for safety and denies any acute withdrawal symptoms today.  However his blood pressure is tending to run high and his CIWA was 17 on admission which went down to 12 this morning.  PHQ 2-9:  Flowsheet Row ED from 07/04/2023 in [facility] Office Visit from 03/21/2023 in [facility] Office Visit from 06/15/2022 in [facility]  Thoughts that you would be better off dead, or of hurting yourself in some way Not at all Not at all Not at all  PHQ-9 Total Score 6 4 3        Flowsheet Row ED from 07/04/2023 in [facility] ED from 07/03/2023 in [facility] ED from 06/14/2023 in [facility])  C-SSRS RISK CATEGORY No Risk Low Risk No Risk       Screenings    Flowsheet Row Most Recent Value  CIWA-Ar Total 12       Total Time spent with patient: 30 minutes  Musculoskeletal  Strength & Muscle Tone: within normal limits Gait & Station: normal Patient leans: N/A  Psychiatric Specialty Exam  Presentation General Appearance:  Appropriate for Environment  Eye Contact: Fair  Speech: Clear and Coherent  Speech Volume: Normal  Handedness: Right   Mood and Affect  Mood: Anxious; Depressed  Affect: Appropriate   Thought Process  Thought Processes: Coherent  Descriptions of Associations:Intact  Orientation:Full (Time, Place and Person)  Thought Content:Logical  Diagnosis of Schizophrenia or Schizoaffective disorder in past: No   Hallucinations:Hallucinations: None  Ideas of Reference:None  Suicidal Thoughts:Suicidal Thoughts: No SI Passive Intent and/or Plan: Without Plan; Without Intent  Homicidal Thoughts:Homicidal Thoughts: No   Sensorium  Memory:  Immediate Good; Recent Good; Remote Good  Judgment: Fair  Insight: Fair   Chartered certified accountant: Fair  Attention Span: Fair  Recall: [employer] of Knowledge: Fair  Language: Fair   Psychomotor Activity  Psychomotor Activity: Psychomotor Activity: Normal   Assets  Assets: Communication Skills   Sleep  Sleep: Sleep: Fair Number of  Hours of Sleep: 2   Nutritional Assessment (For OBS and FBC admissions only) Has the patient had a weight loss or gain of 10 pounds or more in the last 3 months?: No Has the patient had a decrease in food intake/or appetite?: No Does the patient have dental problems?: No Does the patient have eating habits or behaviors that may be indicators of an eating disorder including binging or inducing vomiting?: No Has the patient recently lost weight without trying?: 0 Has the patient been eating poorly because of a decreased appetite?: 0 Malnutrition Screening Tool Score: 0    Physical Exam ROS  Blood pressure (!) 155/94, pulse 60, temperature 98 F (36.7 C), temperature source Oral, resp. rate 18, SpO2 99%. There is no height or weight on file to calculate BMI.  Past Psychiatric History: Patient gives a history of bipolar disorder and apparently was hospitalized once in [place]  21 years ago.  He states that he was placed on medications and has been seeing an outpatient psychiatrist and although he saw somebody last month he has been off his medications for quite a while.  Is the patient at risk to self? No  Has the patient been a risk to self in the past 6 months? No .    Has the patient been a risk to self within the distant past? No   Is the patient a risk to others? No   Has the patient been a risk to others in the past 6 months? No   Has the patient been a risk to others within the distant past? No   Past Medical History: Patient gives a history of multiple surgeries and claims that he has at least 14 surgeries in the past.  He also has a prior history of hypertension but apparently he was also morbidly obese at 358 pounds and he had a gastric bypass several years ago with improved hypertension. Family History: Unknown at this time Social History: Patient was married for over 21 years and has 2 children age 29 and 69.  He has been divorced and has been living in [place]  for over  4-1/2 years with his girlfriend.  He lives with her and was gainfully employed until recently.  Last Labs:  Admission on 07/03/2023, Discharged on 07/04/2023  Component Date Value Ref Range Status   WBC 07/03/2023 4.7  4.0 - 10.5 K/uL Final   RBC 07/03/2023 5.51  4.22 - 5.81 MIL/uL Final   Hemoglobin 07/03/2023 16.4  13.0 - 17.0 g/dL Final   HCT 40/98/1191 47.4  39.0 - 52.0 % Final   MCV 07/03/2023 86.0  80.0 - 100.0 fL Final   MCH 07/03/2023 29.8  26.0 - 34.0 pg Final   MCHC 07/03/2023 34.6  30.0 - 36.0 g/dL Final   RDW 47/82/9562 12.2  11.5 - 15.5 % Final   Platelets 07/03/2023 257  150 - 400 K/uL Final   nRBC 07/03/2023 0.0  0.0 - 0.2 % Final   Neutrophils Relative % 07/03/2023 59  % Final   Neutro Abs 07/03/2023 2.8  1.7 - 7.7 K/uL Final   Lymphocytes Relative 07/03/2023 27  %  Final   Lymphs Abs 07/03/2023 1.3  0.7 - 4.0 K/uL Final   Monocytes Relative 07/03/2023 9  % Final   Monocytes Absolute 07/03/2023 0.4  0.1 - 1.0 K/uL Final   Eosinophils Relative 07/03/2023 3  % Final   Eosinophils Absolute 07/03/2023 0.2  0.0 - 0.5 K/uL Final   Basophils Relative 07/03/2023 2  % Final   Basophils Absolute 07/03/2023 0.1  0.0 - 0.1 K/uL Final   Immature Granulocytes 07/03/2023 0  % Final   Abs Immature Granulocytes 07/03/2023 0.01  0.00 - 0.07 K/uL Final   Performed at [facility], [address] [place], [place] [address]   Sodium 07/03/2023 139  135 - 145 mmol/L Final   Potassium 07/03/2023 3.8  3.5 - 5.1 mmol/L Final   Chloride 07/03/2023 105  98 - 111 mmol/L Final   CO2 07/03/2023 21 (L)  22 - 32 mmol/L Final   Glucose, Bld 07/03/2023 75  70 - 99 mg/dL Final   Glucose reference range applies only to samples taken after fasting for at least 8 hours.   BUN 07/03/2023 12  6 - 20 mg/dL Final   Creatinine, Ser 07/03/2023 0.93  0.61 - 1.24 mg/dL Final   Calcium 21/30/8657 9.0  8.9 - 10.3 mg/dL Final   Total Protein 84/69/6295 6.1 (L)  6.5 - 8.1 g/dL Final   Albumin 28/41/3244  3.9  3.5 - 5.0 g/dL Final   AST 05/26/7251 34  15 - 41 U/L Final   ALT 07/03/2023 37  0 - 44 U/L Final   Alkaline Phosphatase 07/03/2023 48  38 - 126 U/L Final   Total Bilirubin 07/03/2023 0.9  0.0 - 1.2 mg/dL Final   GFR, Estimated 07/03/2023 >60  >60 mL/min Final   Comment: (NOTE) Calculated using the CKD-EPI Creatinine Equation (2021)    Anion gap 07/03/2023 13  5 - 15 Final   Performed at [facility], [address], [place], [place] [address]   Hgb A1c MFr Bld 07/03/2023 4.4 (L)  4.8 - 5.6 % Final   Comment: (NOTE) Pre diabetes:          5.7%-6.4%  Diabetes:              >6.4%  Glycemic control for   <7.0% adults with diabetes    Mean Plasma Glucose 07/03/2023 79.58  mg/dL Final   Performed at [facility], [address] [place], [place] [address]   Alcohol, Ethyl (B) 07/03/2023 49 (H)  <10 mg/dL Final   Comment: (NOTE) Lowest detectable limit for serum alcohol is 10 mg/dL.  For medical purposes only. Performed at [facility], [address] [place], [place] [address]    Cholesterol 07/03/2023 191  0 - 200 mg/dL Final   Triglycerides 87/56/4332 231 (H)  <150 mg/dL Final   HDL 95/18/8416 75  >40 mg/dL Final   Total CHOL/HDL Ratio 07/03/2023 2.5  RATIO Final   VLDL 07/03/2023 46 (H)  0 - 40 mg/dL Final   LDL Cholesterol 07/03/2023 70  0 - 99 mg/dL Final   Comment:        Total Cholesterol/HDL:CHD Risk Coronary Heart Disease Risk Table                     Men   Women  1/2 Average Risk   3.4   3.3  Average Risk       5.0   4.4  2 X Average  Risk   9.6   7.1  3 X Average Risk  23.4   11.0        Use the calculated Patient Ratio above and the CHD Risk Table to determine the patient's CHD Risk.        ATP III CLASSIFICATION (LDL):  <100     mg/dL   Optimal  161-096  mg/dL   Near or Above                    Optimal  130-159  mg/dL   Borderline  045-409  mg/dL   High  >811     mg/dL   Very High Performed at [facility], [address], [place], [place] [address]    TSH 07/03/2023 4.431  0.350 - 4.500 uIU/mL Final   Comment: Performed by a 3rd Generation assay with a functional sensitivity of <=0.01 uIU/mL. Performed at [facility], [address], [place], [place] [address]   Office Visit on 03/21/2023  Component Date Value Ref Range Status   Testosterone , Total, LC-MS-MS 03/21/2023 219 (L)  250 - 1,100 ng/dL Final   Comment: Men with clinically significant hypogonadal symptoms and testosterone  values repeatedly in the range of the 200-300 ng/dL or less, may benefit from testosterone  treatment after adequate risk and benefits counseling. . For additional information, please refer to https://education.[facility].com/faq/FAQ165 (This link is being provided for informational/educational purposes only.) (Note) . This test was developed and its analytical performance  characteristics have been determined by [facility]. It has  not been cleared or approved by the FDA. This assay has  been validated pursuant to the CLIA regulations and is  used for clinical purposes. . .    Free Testosterone  03/21/2023 31.1 (L)  35.0 - 155.0 pg/mL Final   Comment: (Note) This test was developed and its analytical performance  characteristics have been determined by [facility]. It has  not been cleared or approved by the FDA. This assay has  been validated pursuant to the CLIA regulations and is  used for clinical purposes. . [facility] [address] [place] [address] [phone] Debroah Fanning L. Amaryllis Junior, MD, PhD    Vitamin B-12 03/21/2023 292  200 - 1,100 pg/mL Final   Comment: . Please Note: Although the reference range for vitamin B12 is [phone] pg/mL, it has been reported that between 5 and 10% of patients with values between 200 and 400 pg/mL may experience neuropsychiatric and hematologic abnormalities due to occult B12 deficiency; less than 1% of patients with values above 400  pg/mL will have symptoms. .    Glucose, Bld 03/21/2023 91  65 - 99 mg/dL Final   Comment: .            Fasting reference interval .    BUN 03/21/2023 18  7 - 25 mg/dL Final   Creat 28/41/3244 0.87  0.70 - 1.30 mg/dL Final   eGFR 05/26/7251 103  > OR = 60 mL/min/1.39m2 Final   BUN/Creatinine Ratio 03/21/2023 SEE NOTE:  6 - 22 (calc) Final   Comment:    Not Reported: BUN and Creatinine are within    reference range. .    Sodium 03/21/2023 141  135 - 146 mmol/L Final   Potassium 03/21/2023 4.3  3.5 - 5.3 mmol/L Final   Chloride 03/21/2023 107  98 - 110 mmol/L Final   CO2 03/21/2023 25  20 - 32 mmol/L Final   Calcium 03/21/2023 9.1  8.6 - 10.3 mg/dL Final   Total Protein 40/98/1191 6.2  6.1 - 8.1 g/dL Final   Albumin 47/82/9562 4.2  3.6 - 5.1 g/dL Final   Globulin 13/12/6576 2.0  1.9 - 3.7 g/dL (calc) Final   AG Ratio 03/21/2023 2.1  1.0 - 2.5 (calc) Final   Total Bilirubin 03/21/2023 0.6  0.2 - 1.2 mg/dL Final   Alkaline phosphatase (APISO) 03/21/2023 50  35 - 144 U/L Final   AST 03/21/2023 31  10 - 35 U/L Final   ALT 03/21/2023 42  9 - 46 U/L Final   WBC 03/21/2023 4.2  3.8 - 10.8 Thousand/uL Final   RBC 03/21/2023 5.12  4.20 - 5.80 Million/uL Final   Hemoglobin 03/21/2023 15.4  13.2 - 17.1 g/dL Final   HCT 46/96/2952 46.2  38.5 - 50.0 % Final   MCV 03/21/2023 90.2  80.0 - 100.0 fL Final   MCH 03/21/2023 30.1  27.0 - 33.0 pg Final   MCHC 03/21/2023 33.3  32.0 - 36.0 g/dL Final   Comment: For adults, a slight decrease in the calculated MCHC value (in the range of 30 to 32 g/dL) is most likely not clinically significant; however, it should be interpreted with caution in correlation with other red cell parameters and the patient's clinical condition.    RDW 03/21/2023 11.9  11.0 - 15.0 % Final   Platelets 03/21/2023 214  140 - 400 Thousand/uL Final   MPV 03/21/2023 10.4  7.5 - 12.5 fL Final   Neutro Abs 03/21/2023 2,516  1,500 - 7,800 cells/uL Final   Absolute Lymphocytes  03/21/2023 1,109  850 - 3,900 cells/uL Final   Absolute Monocytes 03/21/2023 433  200 - 950 cells/uL Final   Eosinophils Absolute 03/21/2023 92  15 - 500 cells/uL Final   Basophils Absolute 03/21/2023 50  0 - 200 cells/uL Final   Neutrophils Relative % 03/21/2023 59.9  % Final   Total Lymphocyte 03/21/2023 26.4  % Final   Monocytes Relative 03/21/2023 10.3  % Final   Eosinophils Relative 03/21/2023 2.2  % Final   Basophils Relative 03/21/2023 1.2  % Final    Allergies: Patient has no known allergies.  Medications:  Facility Ordered Medications  Medication   acetaminophen  (TYLENOL ) tablet 650 mg   alum & mag hydroxide-simeth (MAALOX/MYLANTA) 200-200-20 MG/5ML suspension 30 mL   cloNIDine  (CATAPRES ) tablet 0.1 mg   hydrOXYzine  (ATARAX ) tablet 25 mg   loperamide  (IMODIUM ) capsule 2-4 mg   LORazepam  (ATIVAN ) tablet 1 mg   magnesium  hydroxide (MILK OF MAGNESIA) suspension 30 mL   multivitamin with minerals tablet 1 tablet   ondansetron  (ZOFRAN -ODT) disintegrating tablet 4 mg   thiamine  (VITAMIN B1) tablet 100 mg   traZODone  (DESYREL ) tablet 50 mg   PTA Medications  Medication Sig   albuterol  (VENTOLIN  HFA) 108 (90 Base) MCG/ACT inhaler Inhale 2 puffs into the lungs every 6 (six) hours as needed for wheezing or shortness of breath.   cetirizine (ZYRTEC) 10 MG tablet Take 10 mg by mouth daily.   hydrochlorothiazide  (HYDRODIURIL ) 25 MG tablet Take 1 tablet (25 mg total) by mouth daily.   ondansetron  (ZOFRAN -ODT) 4 MG disintegrating tablet TAKE 1 TABLET BY MOUTH EVERY 8 HOURS AS NEEDED FOR NAUSEA AND VOMITING   dicyclomine  (BENTYL ) 20 MG tablet TAKE 1 TABLET BY MOUTH TWICE A DAY   Oxcarbazepine  (TRILEPTAL ) 300 MG tablet Take 300 mg by mouth daily.   buPROPion  (WELLBUTRIN  XL) 300 MG 24 hr tablet Take 1 tablet (300 mg total) by mouth  daily.   lisinopril  (ZESTRIL ) 10 MG tablet TAKE 1 TABLET BY MOUTH EVERY DAY   sildenafil  (REVATIO ) 20 MG tablet TAKE 5 TABLETS BY MOUTH EVERY OTHER DAY AS  NEEDED 1 HOUR PRIOR TO SEXUAL ACTIVITY.    Long Term Goals: Improvement in symptoms so as ready for discharge  Short Term Goals: Patient will verbalize feelings in meetings with treatment team members., Patient will attend at least of 50% of the groups daily., Pt will complete the PHQ9 on admission, day 3 and discharge., Patient will participate in completing the [place] Suicide Severity Rating Scale, Patient will score a low risk of violence for 24 hours prior to discharge, and Patient will take medications as prescribed daily.  Medical Decision Making  The patient is admitted for routine detox given the fact that his CIWA score was very high.  He was placed on a detox protocol and will be closely monitored.  He is also hypertensive.  We will treat this accordingly.  The plan is to restart his bipolar medications gradually.    Recommendations  Based on my evaluation the patient does not appear to have an emergency medical condition.  Buzz Cass, MD 07/04/23  10:23 AM

## 2023-07-04 NOTE — Progress Notes (Signed)
 Meal given to pt.

## 2023-07-04 NOTE — ED Notes (Signed)
 Pt is currently sleeping, no distress noted, environmental check complete, will continue to monitor patient for safety.

## 2023-07-04 NOTE — ED Notes (Signed)
 Patient is sleeping. Respirations equal and unlabored, skin warm and dry. No change in assessment or acuity. Routine safety checks conducted according to facility protocol. Will continue to monitor for safety.

## 2023-07-04 NOTE — ED Notes (Signed)
 Medication has been given, patient is requesting an ensure due to protein deficiency because of drinking. This nurse advised I will send message to provider.

## 2023-07-04 NOTE — ED Notes (Signed)
 V/s retaken 147/98 76 Patient reports feeling improved.

## 2023-07-04 NOTE — BH Assessment (Addendum)
 Comprehensive Clinical Assessment (CCA) Note   07/04/2023 Aaron Lozano [id number]  Disposition: Kathryne Show, NP recommends facility based crisis center.   The patient demonstrates the following risk factors for suicide: Chronic risk factors for suicide include: substance use disorder. Acute risk factors for suicide include: family or marital conflict and unemployment. Protective factors for this patient include: positive social support. Considering these factors, the overall suicide risk at this point appears to be low. Patient is not appropriate for outpatient follow up.   Pt is a 53 yo male who presents to [facility] voluntarily seeking detox treatment. Pt reports that he currently drinks etoh daily approx 2-3 beers. However, recently he has been drinking more. Pt reports that last night he drank a fifth of bourbon, 4- 10% beers, and 2 fireballs. Pt reports that he has not had any alcohol since this morning and he noticed he is experiencing withdrawal symptons such as tremors and sweating. Pt reports that he also used a Delta 8 last night. Pt reports that recently he had an argument with his fiancee' which caused them to live apart. Pt reports that last week lost his job. Pt reports passive SI thoughts. Pt denies a plan. Pt denies HI and AVH. Pt reports hx of bipolar. Pt reports that he is active with outpatient services at [facility]. Pt reports that he has not been compliant with his psychotropic medications for over a month.  Pt reports that he is currently living separate from his fiancee' in an Air BnB because of a recent argument with her. Pt reports that he is currently unemployed and recently lost a job that he was expecting a promotion. Pt reports that life stressors caused him to binge drink over the last couple of days. Pt reports that his drinking caused him to blackout. Pt is willing to seek detox treatment.    Chief Complaint: Detox  Visit Diagnosis:  Alcohol Use Disorder      CCA Screening, Triage and Referral (STR)  Patient Reported Information How did you hear about us ? Self  What Is the Reason for Your Visit/Call Today? Pt is a 53 yo male who presents to [facility] voluntarily seeking detox treatment. Pt reports that he currently drinks etoh daily approx 2-3 beers. However, recently he has been drinking more. Pt reports that last night he drank a fifth of bourbon, 4- 10% beers, and 2 fireballs. Pt reports that he has not had any alcohol since this morning and he noticed he is experiencing withdrawal symptons such as tremors and sweating. Pt reports that he also used a Delta 8 last night. Pt reports that recently he had an argument with his fiancee' which caused them to live apart. Pt reports that last week lost his job. Pt reports passive SI thoughts. Pt denies a plan. Pt denies HI and AVH. Pt reports hx of bipolar. Pt reports that he is active with outpatient services at [facility]. Pt reports that he has not been compliant with his psychotropic medications for over a month.  How Long Has This Been Causing You Problems? 1 wk - 1 month  What Do You Feel Would Help You the Most Today? Stress Management; Treatment for Depression or other mood problem; Medication(s); Alcohol or Drug Use Treatment   Have You Recently Had Any Thoughts About Hurting Yourself? Yes  Are You Planning to Commit Suicide/Harm Yourself At This time? No   Flowsheet Row ED from 07/04/2023 in [facility] ED from 07/03/2023 in  [place] [facility] ED from 06/14/2023 in [facility] at [facility])  C-SSRS RISK CATEGORY Low Risk Low Risk No Risk       Have you Recently Had Thoughts About Hurting Someone Sherral? No  Are You Planning to Harm Someone at This Time? No  Explanation: n/a  Have You Used Any Alcohol or Drugs in the Past 24 Hours? Yes  How Long Ago Did You Use Drugs or Alcohol? Earlier this  AM What Did You Use and How Much? ETOH- fifth of bourbon, 4- 10% beers, and 2 fireballs   Do You Currently Have a Therapist/Psychiatrist? Yes  Name of Therapist/Psychiatrist: Name of Therapist/Psychiatrist: Therapist and Psychiatrist with [facility]   Have You Been Recently Discharged From Any Office Practice or Programs? No  Explanation of Discharge From Practice/Program: n/a    CCA Screening Triage Referral Assessment Type of Contact: Face-to-Face  Telemedicine Service Delivery:   Is this Initial or Reassessment?   Date Telepsych consult ordered in [facility]:    Time Telepsych consult ordered in [facility]L:    Location of Assessment: [facility]  Provider Location: [facility]   Collateral Involvement: none   Does Patient Have a Automotive Engineer Guardian? No  Legal Guardian Contact Information: none  Copy of Legal Guardianship Form: -- (n/a)  Legal Guardian Notified of Arrival: -- (n/a)  Legal Guardian Notified of Pending Discharge: -- (n/a)  If Minor and Not Living with Parent(s), Who has Custody? n/a  Is CPS involved or ever been involved? Never  Is APS involved or ever been involved? Never   Patient Determined To Be At Risk for Harm To Self or Others Based on Review of Patient Reported Information or Presenting Complaint? Yes, for Self-Harm  Method: No Plan  Availability of Means: No access or NA  Intent: Vague intent or NA  Notification Required: No need or identified person  Additional Information for Danger to Others Potential: -- (n/a)  Additional Comments for Danger to Others Potential: n/a  Are There Guns or Other Weapons in Your Home? No  Types of Guns/Weapons: n/a  Are These Weapons Safely Secured?                            No  Who Could Verify You Are Able To Have These Secured: n/a  Do You Have any Outstanding Charges, Pending Court Dates, Parole/Probation? Pt denies pending legal  charges  Contacted To Inform of Risk of Harm To Self or Others: -- (n/a)    Does Patient Present under Involuntary Commitment? No    [place] of Residence: [place]   Patient Currently Receiving the Following Services: Medication Management; Individual Therapy   Determination of Need: Urgent (48 hours)   Options For Referral: Facility-Based Crisis     CCA Biopsychosocial Patient Reported Schizophrenia/Schizoaffective Diagnosis in Past: No   Strengths: willing to seek treatment   Mental Health Symptoms Depression:  Change in energy/activity; Hopelessness; Worthlessness   Duration of Depressive symptoms: Duration of Depressive Symptoms: Greater than two weeks   Mania:  None   Anxiety:   Worrying   Psychosis:  None   Duration of Psychotic symptoms:    Trauma:  None   Obsessions:  None   Compulsions:  None   Inattention:  None   Hyperactivity/Impulsivity:  None   Oppositional/Defiant Behaviors:  None   Emotional Irregularity:  Chronic feelings of emptiness  Other Mood/Personality Symptoms:  none    Mental Status Exam Appearance and self-care  Stature:  Average   Weight:  Average weight   Clothing:  normal  Grooming:  Normal   Cosmetic use:  None   Posture/gait:  Normal   Motor activity:  Not Remarkable   Sensorium  Attention:  Normal   Concentration:  Normal   Orientation:  X5   Recall/memory:  Normal   Affect and Mood  Affect:  Depressed; Anxious   Mood:  Anxious; Depressed   Relating  Eye contact:  Normal   Facial expression:  Anxious   Attitude toward examiner:  Cooperative   Thought and Language  Speech flow: Clear and Coherent   Thought content:  Appropriate to Mood and Circumstances   Preoccupation:  None   Hallucinations:  None   Organization:  Coherent   [employer] of Knowledge:  Fair   Intelligence:  Average   Abstraction:  Normal   Judgement:  Fair   Dance Movement Psychotherapist:  Realistic    Insight:  Fair   Decision Making:  Normal   Social Functioning  Social Maturity:  Impulsive; Isolates   Social Judgement:  Victimized   Stress  Stressors:  Relationship; Transitions; Work   Coping Ability:  Overwhelmed; Exhausted   Skill Deficits:  Decision making; Self-control; Self-care; Interpersonal   Supports:  Family     Religion: Religion/Spirituality Are You A Religious Person?: No How Might This Affect Treatment?: n/a  Leisure/Recreation:    Exercise/Diet: Exercise/Diet Do You Exercise?: No Have You Gained or Lost A Significant Amount of Weight in the Past Six Months?: No Do You Follow a Special Diet?: No Do You Have Any Trouble Sleeping?: No   CCA Employment/Education Employment/Work Situation: Employment / Work Situation Employment Situation: Unemployed Patient's Job has Been Impacted by Current Illness: No Has Patient ever Been in Equities Trader?: No  Education: Education Is Patient Currently Attending School?: No Last Grade Completed: 12 Did You Product Manager?: No Did You Have An Individualized Education Program (IIEP): No Did You Have Any Difficulty At [employer]?: No Patient's Education Has Been Impacted by Current Illness: No   CCA Family/Childhood History Family and Relationship History: Family history Marital status: Single Does patient have children?: No  Childhood History:  Childhood History By whom was/is the patient raised?: Both parents Did patient suffer any verbal/emotional/physical/sexual abuse as a child?: No Did patient suffer from severe childhood neglect?: No Has patient ever been sexually abused/assaulted/raped as an adolescent or adult?: No Was the patient ever a victim of a crime or a disaster?: No Witnessed domestic violence?: No Has patient been affected by domestic violence as an adult?: No       CCA Substance Use Alcohol/Drug Use: Alcohol / Drug Use Pain Medications: SEE MAR Prescriptions: SEE MAR Over the  Counter: SEE MAR History of alcohol / drug use?: Yes Longest period of sobriety (when/how long): unknown Negative Consequences of Use: Personal relationships Withdrawal Symptoms: Sweats, Tremors Substance #1 Name of Substance 1: Alcohol 1 - Age of First Use: Unknown 1 - Amount (size/oz): 2-3 beers 1 - Frequency: daily 1 - Last Use / Amount: 07/03/23                       ASAM's:  Six Dimensions of Multidimensional Assessment  Dimension 1:  Acute Intoxication and/or Withdrawal Potential:      Dimension 2:  Biomedical Conditions and Complications:      Dimension 3:  Emotional, Behavioral, or Cognitive Conditions and Complications:     Dimension 4:  Readiness to Change:     Dimension 5:  Relapse, Continued use, or Continued Problem Potential:     Dimension 6:  Recovery/Living Environment:     ASAM Severity Score:    ASAM Recommended Level of Treatment: ASAM Recommended Level of Treatment: Level II Intensive Outpatient Treatment   Substance use Disorder (SUD) Substance Use Disorder (SUD)  Checklist Symptoms of Substance Use: Continued use despite having a persistent/recurrent physical/psychological problem caused/exacerbated by use, Continued use despite persistent or recurrent social, interpersonal problems, caused or exacerbated by use  Recommendations for Services/Supports/Treatments: Recommendations for Services/Supports/Treatments Recommendations For Services/Supports/Treatments: Facility Based Crisis  Disposition Recommendation per psychiatric provider: Facility Based Crisis Center [facility]) is recommended at this time.    DSM5 Diagnoses: Patient Active Problem List   Diagnosis Date Noted   Alcohol abuse 07/03/2023   Testosterone  deficiency in male 03/21/2023   Nicotine  dependence due to vaping tobacco product 03/21/2023   Bipolar II disorder (HCC) 11/18/2022   Generalized anxiety disorder 11/18/2022   Anxiety 08/17/2022   Physical exam, annual 06/15/2022    Nausea vomiting and diarrhea 06/15/2022   Hypertension 09/15/2021   Bipolar 1 disorder, depressed (HCC) 09/15/2021   Erectile dysfunction 09/15/2021   Inguinal hernia, right 09/15/2021   Current every day vaping 09/15/2021   Postoperative intestinal obstruction 01/30/2019   Morbid obesity (HCC) 01/23/2019   Bipolar disorder, in partial remission, most recent episode depressed (HCC) 09/28/2018   Back pain 06/26/2018   Depression 06/26/2018   GERD (gastroesophageal reflux disease) 06/26/2018   IBS (irritable bowel syndrome) 06/26/2018   Sleep apnea 06/26/2018     Referrals to Alternative Service(s): Referred to Alternative Service(s):   Place:   Date:   Time:    Referred to Alternative Service(s):   Place:   Date:   Time:    Referred to Alternative Service(s):   Place:   Date:   Time:    Referred to Alternative Service(s):   Place:   Date:   Time:     Rosina PARAS, [place], [facility], NCC

## 2023-07-04 NOTE — Progress Notes (Signed)
   07/03/23 1901  [facility] Triage Screening (Walk-ins at [facility] only)  How Did You Hear About Us ? Self  What Is the Reason for Your Visit/Call Today? Pt is a 53 yo male who presents to [facility] voluntarily seeking detox treatment. Pt reports that he currently drinks etoh daily approx 2-3 beers. However, recently he has been drinking more. Pt reports that last night he drank a fifth of bourbon, 4- 10% beers, and 2 fireballs. Pt reports that he has not had any alcohol since this morning and he noticed he is experiencing withdrawal symptons such as tremors and sweating. Pt reports that he also used a Delta 8 last night. Pt reports that recently he had an argument with his fiancee' which caused them to live apart. Pt reports that last week lost his job. Pt reports passive SI thoughts. Pt denies a plan. Pt denies HI and AVH. Pt reports hx of bipolar. Pt reports that he is active with outpatient services at [facility]. Pt reports that he has not been compliant with his psychotropic medications for over a month.  How Long Has This Been Causing You Problems? 1 wk - 1 month  Have You Recently Had Any Thoughts About Hurting Yourself? Yes  How long ago did you have thoughts about hurting yourself? Week  Are You Planning to Commit Suicide/Harm Yourself At This time? No  Have you Recently Had Thoughts About Hurting Someone Sherral? No  Are You Planning To Harm Someone At This Time? No  Physical Abuse Denies  Verbal Abuse Denies  Sexual Abuse Denies  Exploitation of patient/patient's resources Denies  Self-Neglect Denies  Possible abuse reported to:  (n/a)  Are you currently experiencing any auditory, visual or other hallucinations? No  Have You Used Any Alcohol or Drugs in the Past 24 Hours? Yes  What Did You Use and How Much? ETOH- fifth of bourbon, 4- 10% beers, and 2 fireballs  Do you have any current medical co-morbidities that require immediate attention? No  Clinician description of patient  physical appearance/behavior: Pt was cooperative and calm  What Do You Feel Would Help You the Most Today? Stress Management;Treatment for Depression or other mood problem;Medication(s);Alcohol or Drug Use Treatment  If access to [facility] was not available, would you have sought care in the Emergency Department? Yes  Determination of Need Urgent (48 hours)  Options For Referral Facility-Based Crisis  Determination of Need filed? Yes    Flowsheet Row ED from 07/04/2023 in [facility] ED from 07/03/2023 in [facility] ED from 06/14/2023 in [facility] at [facility])  C-SSRS RISK CATEGORY Low Risk Low Risk No Risk

## 2023-07-04 NOTE — Discharge Planning (Signed)
 LCSW spoke with patient this morning regarding disposition plans.  Patient reports he is currently living by himself at this time in a long-term Airbnb that he has until mid-March. Patient reports recently being admitted due to verbal altercation between he and his fiance.  Patient reports he and his fianc got into an argument regarding her 55 year old daughter who has resided in the home with them.  Patient reports frustration around the fact that the 73 year old daughter does nothing for herself and places a lot of demands on her mother.  Patient reports he spoke up about this issue and it became an issue with his fiancee. Patient reports that they have been heavily involved in conversation regarding next steps for their relationship and plans for their children. Patient reports he and his fiance are considering couples counseling, and reports having a good deal of support from fianc.  Patient does report recent use of alcohol daily, stating that he uses between 2-3 beers a day. Patient reports also drinking 1/5 of bourbon and four 10% beers and 2 fireballs due to stress. Patient also reports a recent use of delta 8.  Patient reports he lost his job last week, however reports it was not related to his substance use.  Patient reports he has a history of bipolar disorder and is currently being seen by [facility] for therapy and medication management.  Patient reports he is seen by a therapist every week, and reports he is seen by a physician assistant every fourth week.  Patient denies any legal charges or upcoming court dates.  Patient reports that due to the loss of his job, he knows that his insurance will be cancelling soon. Patient reports at this time he is only interested in [employer] and continued outpatient services with his current provider.  Patient reports he is interested in resources for residential placement. Patient reports his ultimate goal is to get another job and to obtain  insurance so that he is able to go to a residential program once obtained. Patient made aware that there are programs available for the uninsured. However, patient reports his primary concern is to look for another job at this time.  Patient aware that he will be provided a list of additional resources in his AVS.  Patient also will be provided a list of the AA meetings within the local area for his follow-up.  No other needs were reported at this time.  Patient provided LCSW with contact information for fianc [place] [phone] and provided permission for follow up. LCSW will contact as needed.   Amey Bal, LCSW Clinical Social Worker [place] [facility] Ph: [phone]

## 2023-07-04 NOTE — ED Notes (Signed)
 Patient has been awake and alert on unit without distress or complaint.  Patient b/p = 155/94 MD Goli made aware.  No new orders.  Patient has superficial cut on his head.  No bleeding or signs of infection.  Will monitor.

## 2023-07-04 NOTE — ED Notes (Signed)
 Patient complaining of severe anxiety and withdrawal symptoms. This nurse did a CIWA on patient which he scored an 74.

## 2023-07-04 NOTE — Group Note (Signed)
 Group Topic: Positive Affirmations  Group Date: 07/04/2023 Start Time: 1300 End Time: 1330 Facilitators: Arlan Belling, RN  Department: [facility]  Number of Participants: 7  Group Focus: coping skills Treatment Modality:  Behavior Modification Therapy Interventions utilized were patient education Purpose: express feelings, express irrational fears, improve communication skills, increase insight, regain self-worth, and reinforce self-care  Name: Aaron Lozano Date of Birth: 18-Feb-1971  MR: [id number]    Level of Participation: moderate Quality of Participation: attentive and cooperative Interactions with others: gave feedback Mood/Affect: appropriate Triggers (if applicable):   Cognition: coherent/clear Progress: Gaining insight Response:   Plan: follow-up needed  Patients Problems:  Patient Active Problem List   Diagnosis Date Noted   Alcohol abuse 07/03/2023   Testosterone  deficiency in male 03/21/2023   Nicotine  dependence due to vaping tobacco product 03/21/2023   Bipolar II disorder (HCC) 11/18/2022   Generalized anxiety disorder 11/18/2022   Anxiety 08/17/2022   Physical exam, annual 06/15/2022   Nausea vomiting and diarrhea 06/15/2022   Hypertension 09/15/2021   Bipolar 1 disorder, depressed (HCC) 09/15/2021   Erectile dysfunction 09/15/2021   Inguinal hernia, right 09/15/2021   Current every day vaping 09/15/2021   Postoperative intestinal obstruction 01/30/2019   Morbid obesity (HCC) 01/23/2019   Bipolar disorder, in partial remission, most recent episode depressed (HCC) 09/28/2018   Back pain 06/26/2018   Depression 06/26/2018   GERD (gastroesophageal reflux disease) 06/26/2018   IBS (irritable bowel syndrome) 06/26/2018   Sleep apnea 06/26/2018

## 2023-07-04 NOTE — ED Notes (Signed)
 Patient is asleep in bed.  Without distress.  He remains diaphoretic however no evidence of seizure activity at this time.  Will continue to monitor and mange etoh withdrawal symptoms.

## 2023-07-05 DIAGNOSIS — Z79899 Other long term (current) drug therapy: Secondary | ICD-10-CM | POA: Diagnosis not present

## 2023-07-05 DIAGNOSIS — F10130 Alcohol abuse with withdrawal, uncomplicated: Secondary | ICD-10-CM | POA: Diagnosis not present

## 2023-07-05 DIAGNOSIS — R4589 Other symptoms and signs involving emotional state: Secondary | ICD-10-CM | POA: Diagnosis not present

## 2023-07-05 DIAGNOSIS — Z91148 Patient's other noncompliance with medication regimen for other reason: Secondary | ICD-10-CM | POA: Diagnosis not present

## 2023-07-05 LAB — PROLACTIN: Prolactin: 8.9 ng/mL (ref 3.6–25.2)

## 2023-07-05 MED ORDER — AMLODIPINE BESYLATE 5 MG PO TABS
5.0000 mg | ORAL_TABLET | Freq: Every day | ORAL | Status: DC
  Administered 2023-07-05 – 2023-07-06 (×2): 5 mg via ORAL
  Filled 2023-07-05 (×2): qty 1

## 2023-07-05 NOTE — Discharge Instructions (Signed)
 Plan is for patient to resume care with current providers for therapy and medication management. Patient has been provided a list of AA meetings within the surrounding area for his review. Additional resources have been provided below as needed.   [facility] [address], [place], [address] [phone] phone  New Patient Assessment/Therapy Walk-Ins:  Monday and Wednesday: 8 am until slots are full. Every 1st and 2nd Fridays of the month: 1 pm - 5 pm.  NO ASSESSMENT/THERAPY WALK-INS ON TUESDAYS OR THURSDAYS  New Patient Assessment/Medication Management Walk-Ins:  Monday - Friday:  8 am - 11 am.  For all walk-ins, we ask that you arrive by 7:30 am because patients will be seen in the order of arrival.  Availability is limited; therefore, you may not be seen on the same day that you walk-in.  Our goal is to serve and meet the needs of our community to the best of our [place] ability.  SUBSTANCE USE TREATMENT for Medicaid and State Funded/IPRS  [facility] [address], [place], [address] [phone] phone NOTE: [facility] is no longer offering IOP services.  Serves those who are low-income or have no insurance.  [facility] [address], [place], [place], [address] [phone] phone [phone] fax NOTE: Does have Substance Abuse-Intensive Outpatient Program [facility]) as well as transitional housing if eligible.  [facility] [address] [place], [place], [address] [phone] phone [phone] fax  [facility] [phone] [address] [place], [place], [address] [phone] phone [phone] fax  HALFWAY HOUSES:  [facility] [phone]  [email or web address]  12 STEP PROGRAMS:  Alcoholics Anonymous of [employer]  Narcotics Anonymous of [place] [employer]  Al-Anon of [place], [place]  www.[place]alanon.org/find-meetings.html  Nar-Anon https://nar-anon.org/find-a-meetin  List of Residential placements:   [facility] in [place]: [phone]  [facility]: [phone]  [place], [place] [phone]: Male and male facility; 30-day program: (uninsured and Medicaid such as Laurena Bering, [place], [place], partners)  [facility]: [phone]; men and women's facility; 28 days; Can have Medicaid tailored plan Tour manager or Partners)  [facility]: [phone] Karoline Caldwell or Larita Fife; 28 day program; must be fully detox; tailored Medicaid or no insurance  [address] in [place], [place]; [phone]; 28 day all males program; no insurance accepted  [facility] in [place]: Gabriel Rung [phone] (no insurance or Medicaid only); 90 days; outpatient services but provide housing in apartments [place]  [facility] Admission: [phone]: Patient must complete phone screening for placement: [place], [employer]; 6 month program; uninsured, Medicaid, and [employer].   [facility]: no insurance required; [phone]  [facility]: [phone]; Intake: Molly Maduro; Must fill out application online; Alecia Lemming Delay [phone] x [address] [facility] in [place], [place]: [phone]; Admissions Coordinators Mr. Maurine Minister or Barron Alvine; 90 day program.  [facility]: [place], [place] [phone]; Co-Ed 9 month to a year program; Online application; Men entry fee is $500 (6-55months);  [employer]: [address] [place], [place] [address]; no fee or insurance required; minimum of 2 years; Highly structured; work based; Intake Coordinator is Thayer Ohm [phone]  [facility] in [place], [place]: [phone]; Fax number is [phone]; website: [email or web address]; Requires 3-6 page autobiography; 2 year program (18 months and then 55month transitional housing);  Admission fee is $300; no insurance needed; work Automotive engineer in [place], [place]: [employer] Desk Staff: Danise Edge [phone]: They have a Men's Regenerations Program 6-38months.  Free program; There is an initial $300 fee however, they are willing to work with patients regarding that. Application is online.  [facility]: Admissions [phone] Doran Heater ext [phone]; Any 7-90 day program is out of pocket; 12 month program is free of charge; there is a $275 entry fee; Patient is responsible for own transportation

## 2023-07-05 NOTE — ED Notes (Signed)
Patient is sleeping. Respirations equal and unlabored, skin warm and dry. No change in assessment or acuity. Routine safety checks conducted according to facility protocol. Will continue to monitor for safety.

## 2023-07-05 NOTE — Group Note (Signed)
Group Topic: Social Support  Group Date: 07/05/2023 Start Time: 1930 End Time: 2000 Facilitators: Rae Lips B  Department: [facility]  Number of Participants: 9  Group Focus: check in Treatment Modality:  Individual Therapy Interventions utilized were leisure development Purpose: express feelings  Name: Aaron Lozano Date of Birth: [birth year]  MR: [id number]    Level of Participation: active Quality of Participation: attentive and cooperative Interactions with others: gave feedback Mood/Affect: appropriate Triggers (if applicable): NA Cognition: coherent/clear Progress: Gaining insight Response: NA Plan: patient will be encouraged to keep going to groups.   Patients Problems:  Patient Active Problem List   Diagnosis Date Noted   Alcohol abuse 07/03/2023   Testosterone deficiency in male 03/21/2023   Nicotine dependence due to vaping tobacco product 03/21/2023   Bipolar II disorder (HCC) 11/18/2022   Generalized anxiety disorder 11/18/2022   Anxiety 08/17/2022   Physical exam, annual 06/15/2022   Nausea vomiting and diarrhea 06/15/2022   Hypertension 09/15/2021   Bipolar 1 disorder, depressed (HCC) 09/15/2021   Erectile dysfunction 09/15/2021   Inguinal hernia, right 09/15/2021   Current every day vaping 09/15/2021   Postoperative intestinal obstruction 01/30/2019   Morbid obesity (HCC) 01/23/2019   Bipolar disorder, in partial remission, most recent episode depressed (HCC) 09/28/2018   Back pain 06/26/2018   Depression 06/26/2018   GERD (gastroesophageal reflux disease) 06/26/2018   IBS (irritable bowel syndrome) 06/26/2018   Sleep apnea 06/26/2018

## 2023-07-05 NOTE — ED Provider Notes (Addendum)
Behavioral Health Progress Note  Date and Time: 07/05/2023 10:34 AM Name: Aaron Lozano MRN:  [id number] Reason for admission:   The patient is a 53 year old male with a past history of alcohol abuse and bipolar disorder who presented himself voluntarily reporting increasing depression and increased alcohol use.  Apparently recently lost his job and has been drinking heavily.  He states that he has been struggling with alcohol consumption for over 3 years but recently escalated the use after he lost his job for the last 3 months.  He attributes increased stress at work and relationship difficulties.  He reports that he has been drinking at least 3 cans of 24 ounce, 9.5% beer every day but over the last 4 or 5 days she is escalated and has been drinking anything and everything he can get his hands on.  He apparently fell at home on Saturday before admission because he was intoxicated and does not know what happened. Patient also reports that he has a long history of bipolar disorder and has not been taking his prescribed medication for several months and has stopped taking his Trileptal in over a month and not taken Depakote or Wellbutrin for several months.  He would like to restart his medications if possible.  Today he denied any active SI/HI/AVH and denied any passive thoughts.  Subjective:  When seen today the patient reports feeling better.  She was going through acute withdrawals yesterday and his CIWA scores have gradually improved.  Patient is getting as needed Ativan.  His blood pressure has been fluctuating and he was started on amlodipine 5 mg a day.  He also received as needed clonidine. Today he is alert oriented and cooperative and pleasant.  He maintained good eye contact.  His speech is coherent.  He reports that he is not sweating as bad today.  He denies any active SI/HI/AVH.  He is making plans for outpatient counseling and rehab.  He is also thinking of going to long-term rehab if  possible.  Diagnosis:  Final diagnoses:  None    Total Time spent with patient: 30 minutes  Past Psychiatric History: See H&P Past Medical History: Please see H&P Family History: Please see Fa please see H&P mily Psychiatric  History: Please see H&P Social History: Please see H&P  Additional Social History:     Patient reports that he has good social support.  His girlfriend is supportive and he has contacted his therapist.  He will be looking for a job and he has a place to stay.                    Sleep: Good  Appetite:  Good  Current Medications:  Current Facility-Administered Medications  Medication Dose Route Frequency Provider Last Rate Last Admin   acetaminophen (TYLENOL) tablet 650 mg  650 mg Oral Q6H PRN Ajibola, Ene A, NP   650 mg at 07/05/23 1030   alum & mag hydroxide-simeth (MAALOX/MYLANTA) 200-200-20 MG/5ML suspension 30 mL  30 mL Oral Q4H PRN Ajibola, Ene A, NP       amLODipine (NORVASC) tablet 5 mg  5 mg Oral Daily Onuoha, Chinwendu V, NP   5 mg at 07/05/23 0750   [START ON 07/06/2023] buPROPion (WELLBUTRIN XL) 24 hr tablet 150 mg  150 mg Oral Daily Sharan Mcenaney, MD       cloNIDine (CATAPRES) tablet 0.1 mg  0.1 mg Oral QID PRN Rex Kras, MD       feeding supplement (ENSURE  ENLIVE / ENSURE PLUS) liquid 237 mL  237 mL Oral BID BM Onuoha, Chinwendu V, NP   237 mL at 07/05/23 1005   hydrOXYzine (ATARAX) tablet 25 mg  25 mg Oral Q6H PRN Ajibola, Ene A, NP   25 mg at 07/04/23 2022   loperamide (IMODIUM) capsule 2-4 mg  2-4 mg Oral PRN Ajibola, Ene A, NP       LORazepam (ATIVAN) tablet 1 mg  1 mg Oral Q6H PRN Rex Kras, MD   1 mg at 07/05/23 1007   magnesium hydroxide (MILK OF MAGNESIA) suspension 30 mL  30 mL Oral Daily PRN Ajibola, Ene A, NP       multivitamin with minerals tablet 1 tablet  1 tablet Oral Daily Ajibola, Ene A, NP   1 tablet at 07/05/23 1004   neomycin-bacitracin-polymyxin [phone] OINT 1 Application  1 Application Topical  Daily PRN Maryagnes Amos, FNP   1 Application at 07/04/23 1544   nicotine (NICODERM CQ - dosed in mg/24 hours) patch 21 mg  21 mg Transdermal Daily Maryagnes Amos, FNP   21 mg at 07/05/23 1004   ondansetron (ZOFRAN-ODT) disintegrating tablet 4 mg  4 mg Oral Q6H PRN Ajibola, Ene A, NP   4 mg at 07/04/23 2022   OXcarbazepine (TRILEPTAL) tablet 150 mg  150 mg Oral BID Rex Kras, MD   150 mg at 07/05/23 1004   thiamine (VITAMIN B1) tablet 100 mg  100 mg Oral Daily Ajibola, Ene A, NP   100 mg at 07/05/23 1004   traZODone (DESYREL) tablet 50 mg  50 mg Oral QHS PRN Ajibola, Ene A, NP   50 mg at 07/04/23 2101   Current Outpatient Medications  Medication Sig Dispense Refill   albuterol (VENTOLIN HFA) 108 (90 Base) MCG/ACT inhaler Inhale 2 puffs into the lungs every 6 (six) hours as needed for wheezing or shortness of breath. 8 g 1   sildenafil (REVATIO) 20 MG tablet TAKE 5 TABLETS BY MOUTH EVERY OTHER DAY AS NEEDED 1 HOUR PRIOR TO SEXUAL ACTIVITY. 75 tablet 0   buPROPion (WELLBUTRIN XL) 300 MG 24 hr tablet Take 1 tablet (300 mg total) by mouth daily. (Patient not taking: Reported on 07/04/2023) 90 tablet 0   cetirizine (ZYRTEC) 10 MG tablet Take 10 mg by mouth daily as needed for allergies. (Patient not taking: Reported on 07/04/2023)     dicyclomine (BENTYL) 20 MG tablet TAKE 1 TABLET BY MOUTH TWICE A DAY (Patient not taking: Reported on 07/04/2023) 180 tablet 1   hydrochlorothiazide (HYDRODIURIL) 25 MG tablet Take 1 tablet (25 mg total) by mouth daily. (Patient not taking: Reported on 07/04/2023) 90 tablet 3   lisinopril (ZESTRIL) 10 MG tablet TAKE 1 TABLET BY MOUTH EVERY DAY (Patient not taking: Reported on 07/04/2023) 90 tablet 1   Oxcarbazepine (TRILEPTAL) 300 MG tablet Take 300 mg by mouth daily. (Patient not taking: Reported on 07/04/2023)      Labs  Lab Results:  Admission on 07/03/2023, Discharged on 07/04/2023  Component Date Value Ref Range Status   WBC 07/03/2023 4.7  4.0 -  10.5 K/uL Final   RBC 07/03/2023 5.51  4.22 - 5.81 MIL/uL Final   Hemoglobin 07/03/2023 16.4  13.0 - 17.0 g/dL Final   HCT 16/02/9603 47.4  39.0 - 52.0 % Final   MCV 07/03/2023 86.0  80.0 - 100.0 fL Final   MCH 07/03/2023 29.8  26.0 - 34.0 pg Final   MCHC 07/03/2023 34.6  30.0 - 36.0 g/dL Final  RDW 07/03/2023 12.2  11.5 - 15.5 % Final   Platelets 07/03/2023 257  150 - 400 K/uL Final   nRBC 07/03/2023 0.0  0.0 - 0.2 % Final   Neutrophils Relative % 07/03/2023 59  % Final   Neutro Abs 07/03/2023 2.8  1.7 - 7.7 K/uL Final   Lymphocytes Relative 07/03/2023 27  % Final   Lymphs Abs 07/03/2023 1.3  0.7 - 4.0 K/uL Final   Monocytes Relative 07/03/2023 9  % Final   Monocytes Absolute 07/03/2023 0.4  0.1 - 1.0 K/uL Final   Eosinophils Relative 07/03/2023 3  % Final   Eosinophils Absolute 07/03/2023 0.2  0.0 - 0.5 K/uL Final   Basophils Relative 07/03/2023 2  % Final   Basophils Absolute 07/03/2023 0.1  0.0 - 0.1 K/uL Final   Immature Granulocytes 07/03/2023 0  % Final   Abs Immature Granulocytes 07/03/2023 0.01  0.00 - 0.07 K/uL Final   Performed at [facility], [address], [place], [place] [address]   Sodium 07/03/2023 139  135 - 145 mmol/L Final   Potassium 07/03/2023 3.8  3.5 - 5.1 mmol/L Final   Chloride 07/03/2023 105  98 - 111 mmol/L Final   CO2 07/03/2023 21 (L)  22 - 32 mmol/L Final   Glucose, Bld 07/03/2023 75  70 - 99 mg/dL Final   Glucose reference range applies only to samples taken after fasting for at least 8 hours.   BUN 07/03/2023 12  6 - 20 mg/dL Final   Creatinine, Ser 07/03/2023 0.93  0.61 - 1.24 mg/dL Final   Calcium 29/52/8413 9.0  8.9 - 10.3 mg/dL Final   Total Protein 24/40/1027 6.1 (L)  6.5 - 8.1 g/dL Final   Albumin 25/36/6440 3.9  3.5 - 5.0 g/dL Final   AST 34/74/2595 34  15 - 41 U/L Final   ALT 07/03/2023 37  0 - 44 U/L Final   Alkaline Phosphatase 07/03/2023 48  38 - 126 U/L Final   Total Bilirubin 07/03/2023 0.9  0.0 - 1.2 mg/dL Final   GFR,  Estimated 07/03/2023 >60  >60 mL/min Final   Comment: (NOTE) Calculated using the CKD-EPI Creatinine Equation (2021)    Anion gap 07/03/2023 13  5 - 15 Final   Performed at [facility], [address], [place], [place] [address]   Hgb A1c MFr Bld 07/03/2023 4.4 (L)  4.8 - 5.6 % Final   Comment: (NOTE) Pre diabetes:          5.7%-6.4%  Diabetes:              >6.4%  Glycemic control for   <7.0% adults with diabetes    Mean Plasma Glucose 07/03/2023 79.58  mg/dL Final   Performed at [facility], [address], [place], [place] [address]   Alcohol, Ethyl (B) 07/03/2023 49 (H)  <10 mg/dL Final   Comment: (NOTE) Lowest detectable limit for serum alcohol is 10 mg/dL.  For medical purposes only. Performed at [facility], [address], [place], [place] [address]    Cholesterol 07/03/2023 191  0 - 200 mg/dL Final   Triglycerides 41/66/0630 231 (H)  <150 mg/dL Final   HDL 16/05/930 75  >40 mg/dL Final   Total CHOL/HDL Ratio 07/03/2023 2.5  RATIO Final   VLDL 07/03/2023 46 (H)  0 - 40 mg/dL Final   LDL Cholesterol 07/03/2023 70  0 - 99 mg/dL Final   Comment:  Total Cholesterol/HDL:CHD Risk Coronary Heart Disease Risk Table                     Men   Women  1/2 Average Risk   3.4   3.3  Average Risk       5.0   4.4  2 X Average Risk   9.6   7.1  3 X Average Risk  23.4   11.0        Use the calculated Patient Ratio above and the CHD Risk Table to determine the patient's CHD Risk.        ATP III CLASSIFICATION (LDL):  <100     mg/dL   Optimal  080-223  mg/dL   Near or Above                    Optimal  130-159  mg/dL   Borderline  361-224  mg/dL   High  >497     mg/dL   Very High Performed at [facility], [address] [place], [place] [address]    TSH 07/03/2023 4.431  0.350 - 4.500 uIU/mL Final   Comment: Performed by a 3rd Generation assay with a functional sensitivity of <=0.01 uIU/mL. Performed at [facility], [address], [place], [place] [address]    Prolactin 07/03/2023 8.9  3.6 - 25.2 ng/mL Final   Comment: (NOTE) Performed At: [facility] [address] [place], [place] [id number] Jolene Schimke MD ID:[id number]   Office Visit on 03/21/2023  Component Date Value Ref Range Status   Testosterone, Total, LC-MS-MS 03/21/2023 219 (L)  250 - 1,100 ng/dL Final   Comment: Men with clinically significant hypogonadal symptoms and testosterone values repeatedly in the range of the 200-300 ng/dL or less, may benefit from testosterone treatment after adequate risk and benefits counseling. . For additional information, please refer to https://education.questdiagnostics.com/faq/FAQ165 (This link is being provided for informational/educational purposes only.) (Note) . This test was developed and its analytical performance  characteristics have been determined by [facility]. It has  not been cleared or approved by the FDA. This assay has  been validated pursuant to the CLIA regulations and is  used for clinical purposes. . .    Free Testosterone 03/21/2023 31.1 (L)  35.0 - 155.0 pg/mL Final   Comment: (Note) This test was developed and its analytical performance  characteristics have been determined by [facility]. It has  not been cleared or approved by the FDA. This assay has  been validated pursuant to the CLIA regulations and is  used for clinical purposes. . [facility] [address] [place] [address] [phone] Junita Push L. Thompson Caul, MD, PhD    Vitamin B-12 03/21/2023 292  200 - 1,100 pg/mL Final   Comment: . Please Note: Although the reference range for vitamin B12 is [phone] pg/mL, it has been reported that between 5 and 10% of patients with values between 200 and 400 pg/mL may experience neuropsychiatric and hematologic abnormalities due to occult B12 deficiency; less than 1% of patients with values above 400 pg/mL will have symptoms. .     Glucose, Bld 03/21/2023 91  65 - 99 mg/dL Final   Comment: .            Fasting reference interval .    BUN 03/21/2023 18  7 - 25 mg/dL Final   Creat 15/37/9432 0.87  0.70 - 1.30 mg/dL Final   eGFR 76/14/7092  103  > OR = 60 mL/min/1.73m2 Final   BUN/Creatinine Ratio 03/21/2023 SEE NOTE:  6 - 22 (calc) Final   Comment:    Not Reported: BUN and Creatinine are within    reference range. .    Sodium 03/21/2023 141  135 - 146 mmol/L Final   Potassium 03/21/2023 4.3  3.5 - 5.3 mmol/L Final   Chloride 03/21/2023 107  98 - 110 mmol/L Final   CO2 03/21/2023 25  20 - 32 mmol/L Final   Calcium 03/21/2023 9.1  8.6 - 10.3 mg/dL Final   Total Protein 40/02/2724 6.2  6.1 - 8.1 g/dL Final   Albumin 36/64/4034 4.2  3.6 - 5.1 g/dL Final   Globulin 74/25/9563 2.0  1.9 - 3.7 g/dL (calc) Final   AG Ratio 03/21/2023 2.1  1.0 - 2.5 (calc) Final   Total Bilirubin 03/21/2023 0.6  0.2 - 1.2 mg/dL Final   Alkaline phosphatase (APISO) 03/21/2023 50  35 - 144 U/L Final   AST 03/21/2023 31  10 - 35 U/L Final   ALT 03/21/2023 42  9 - 46 U/L Final   WBC 03/21/2023 4.2  3.8 - 10.8 Thousand/uL Final   RBC 03/21/2023 5.12  4.20 - 5.80 Million/uL Final   Hemoglobin 03/21/2023 15.4  13.2 - 17.1 g/dL Final   HCT 87/56/4332 46.2  38.5 - 50.0 % Final   MCV 03/21/2023 90.2  80.0 - 100.0 fL Final   MCH 03/21/2023 30.1  27.0 - 33.0 pg Final   MCHC 03/21/2023 33.3  32.0 - 36.0 g/dL Final   Comment: For adults, a slight decrease in the calculated MCHC value (in the range of 30 to 32 g/dL) is most likely not clinically significant; however, it should be interpreted with caution in correlation with other red cell parameters and the patient's clinical condition.    RDW 03/21/2023 11.9  11.0 - 15.0 % Final   Platelets 03/21/2023 214  140 - 400 Thousand/uL Final   MPV 03/21/2023 10.4  7.5 - 12.5 fL Final   Neutro Abs 03/21/2023 2,516  1,500 - 7,800 cells/uL Final   Absolute Lymphocytes 03/21/2023 1,109  850 - 3,900  cells/uL Final   Absolute Monocytes 03/21/2023 433  200 - 950 cells/uL Final   Eosinophils Absolute 03/21/2023 92  15 - 500 cells/uL Final   Basophils Absolute 03/21/2023 50  0 - 200 cells/uL Final   Neutrophils Relative % 03/21/2023 59.9  % Final   Total Lymphocyte 03/21/2023 26.4  % Final   Monocytes Relative 03/21/2023 10.3  % Final   Eosinophils Relative 03/21/2023 2.2  % Final   Basophils Relative 03/21/2023 1.2  % Final    Blood Alcohol level:  Lab Results  Component Value Date   ETH 49 (H) 07/03/2023    Metabolic Disorder Labs: Lab Results  Component Value Date   HGBA1C 4.4 (L) 07/03/2023   MPG 79.58 07/03/2023   Lab Results  Component Value Date   PROLACTIN 8.9 07/03/2023   Lab Results  Component Value Date   CHOL 191 07/03/2023   TRIG 231 (H) 07/03/2023   HDL 75 07/03/2023   CHOLHDL 2.5 07/03/2023   VLDL 46 (H) 07/03/2023   LDLCALC 70 07/03/2023   LDLCALC 75 06/15/2022    Therapeutic Lab Levels: No results found for: "LITHIUM" No results found for: "VALPROATE" No results found for: "CBMZ"  Physical Findings   AUDIT    Flowsheet Row Office Visit from 03/21/2023 in [facility]  Alcohol Use  Disorder Identification Test Final Score (AUDIT) 3       GAD-7    Flowsheet Row Office Visit from 03/21/2023 in [facility] Office Visit from 06/15/2022 in [facility]  Total GAD-7 Score 1 2      PHQ2-9    Flowsheet Row ED from 07/04/2023 in [place] Office Visit from 03/21/2023 in [facility] Office Visit from 06/15/2022 in [facility] Office Visit from 09/15/2021 in [facility]  PHQ-2 Total Score 2 1 1  0  PHQ-9 Total Score 6 4 3  --      Flowsheet Row ED from 07/04/2023 in [facility] ED from 07/03/2023 in [facility] ED from 06/14/2023 in [facility])  C-SSRS RISK CATEGORY No Risk Low Risk No Risk        Musculoskeletal  Strength & Muscle Tone: within normal limits Gait & Station: normal Patient leans: N/A  Psychiatric Specialty Exam  Presentation  General Appearance:  Appropriate for Environment  Eye Contact: Fair  Speech: Clear and Coherent  Speech Volume: Normal  Handedness: Right   Mood and Affect  Mood: Anxious; Depressed  Affect: Appropriate   Thought Process  Thought Processes: Coherent  Descriptions of Associations:Intact  Orientation:Full (Time, Place and Person)  Thought Content:Logical  Diagnosis of Schizophrenia or Schizoaffective disorder in past: No    Hallucinations:Hallucinations: None  Ideas of Reference:None  Suicidal Thoughts:Suicidal Thoughts: No  Homicidal Thoughts:Homicidal Thoughts: No   Sensorium  Memory: Immediate Good; Recent Good; Remote Good  Judgment: Fair  Insight: Fair   Art therapist  Concentration: Fair  Attention Span: Fair  Recall: Fair  Fund of Knowledge: Fair  Language: Fair   Psychomotor Activity  Psychomotor Activity: Psychomotor Activity: Normal   Assets  Assets: Communication Skills   Sleep  Sleep: Sleep: Fair   No data recorded  Physical Exam  Physical Exam ROS Blood pressure (!) 137/97, pulse 69, temperature 97.9 F (36.6 C), temperature source Oral, resp. rate 16, SpO2 99%. There is no height or weight on file to calculate BMI.  Treatment Plan Summary: Daily contact with patient to assess and evaluate symptoms and progress in treatment and Medication management Continue with CIWA protocol. Begin amlodipine 5 mg a day Consider long-term rehab   Rex Kras, MD 07/05/2023 10:34 AM

## 2023-07-05 NOTE — ED Notes (Signed)
Pt is in the dayroom watching TV with peers. Pt complained about not being able to have enough sleep. Writer advised pt to bring the nicotine patch when he is ready to go to sleep and also will administer sleeping pill for him. Pt denies SI/HI/AVH. Pt has no further complain.No acute distress noted. Will continue to monitor for safety and provide support.

## 2023-07-05 NOTE — Group Note (Signed)
Group Topic: Understanding Self  Group Date: 07/05/2023 Start Time: 1230 End Time: 1300 Facilitators: Prentice Docker, RN  Department: [facility]  Number of Participants: 9  Group Focus: nursing group Treatment Modality:  Psychoeducation Interventions utilized were exploration Purpose: trigger / craving management  Name: Caulder Wehner Date of Birth: 07-30-1970  MR: [id number]      Patients Problems:  Level of Participation: active Quality of Participation: cooperative and passive Interactions with others: gave feedback Mood/Affect: appropriate Triggers (if applicable): none identified Cognition: coherent/clear Progress: Gaining insight Response: "I like being outside getting fresh air and being around my family helps me alot" Plan: patient will be encouraged to continue utilizing learned coping skills to manage depressive thoughts or cravings"

## 2023-07-05 NOTE — Group Note (Signed)
Group Topic: Change and Accountability  Group Date: 07/05/2023 Start Time: 1630 End Time: 1700 Facilitators: Loyce Dys, NT; Elenor Quinones, [place]; Cristal Ford  Department: [facility]  Number of Participants: 8  Group Focus: acceptance Treatment Modality:  Psychoeducation Interventions utilized were patient education Purpose: express feelings  Name: Aaron Lozano Date of Birth: 05/19/1971  MR: [id number]    Level of Participation: active Quality of Participation: cooperative Interactions with others: gave feedback Mood/Affect: appropriate Triggers (if applicable): N/A Cognition: coherent/clear Progress: Moderate Response: PT stated goals were getting and staying sober, rebuilding his life and repairing relationships, start hanging around like-minded people. Plan: follow-up needed  Patients Problems:  Patient Active Problem List   Diagnosis Date Noted   Alcohol abuse 07/03/2023   Testosterone deficiency in male 03/21/2023   Nicotine dependence due to vaping tobacco product 03/21/2023   Bipolar II disorder (HCC) 11/18/2022   Generalized anxiety disorder 11/18/2022   Anxiety 08/17/2022   Physical exam, annual 06/15/2022   Nausea vomiting and diarrhea 06/15/2022   Hypertension 09/15/2021   Bipolar 1 disorder, depressed (HCC) 09/15/2021   Erectile dysfunction 09/15/2021   Inguinal hernia, right 09/15/2021   Current every day vaping 09/15/2021   Postoperative intestinal obstruction 01/30/2019   Morbid obesity (HCC) 01/23/2019   Bipolar disorder, in partial remission, most recent episode depressed (HCC) 09/28/2018   Back pain 06/26/2018   Depression 06/26/2018   GERD (gastroesophageal reflux disease) 06/26/2018   IBS (irritable bowel syndrome) 06/26/2018   Sleep apnea 06/26/2018

## 2023-07-05 NOTE — ED Notes (Signed)
Pt sleeping in no acute distress. RR even and unlabored. Environment secured. Will continue to monitor for safety.

## 2023-07-05 NOTE — ED Notes (Signed)
Pt in group in no acute distress. Reports medication effective in managing nausea and anxiety earlier. Informed pt to notify staff with any needs or concerns. Will monitor for safety.

## 2023-07-05 NOTE — ED Notes (Addendum)
Pt laying on bed in room. Eyes closed. RR even and unlabored. Environment secured. Provider aware of flagged BP but improving. No new orders given. Will continue to monitor for safety.

## 2023-07-05 NOTE — Progress Notes (Signed)
Meal given to pt.

## 2023-07-05 NOTE — Group Note (Signed)
Group Topic: Communication  Group Date: 07/04/2023 Start Time: 1900 End Time: 1930 Facilitators: Rae Lips B  Department: [facility]  Number of Participants: 2  Group Focus: acceptance Treatment Modality:  Exposure Therapy Interventions utilized were leisure development Purpose: express feelings  Name: Guage Efferson Date of Birth: 04/28/1971  MR: [id number]    Level of Participation: active Quality of Participation: cooperative Interactions with others: gave feedback Mood/Affect: appropriate Triggers (if applicable): NA Cognition: coherent/clear Progress: Gaining insight Response: NA Plan: patient will be encouraged to keep going to groups.   Patients Problems:  Patient Active Problem List   Diagnosis Date Noted   Alcohol abuse 07/03/2023   Testosterone deficiency in male 03/21/2023   Nicotine dependence due to vaping tobacco product 03/21/2023   Bipolar II disorder (HCC) 11/18/2022   Generalized anxiety disorder 11/18/2022   Anxiety 08/17/2022   Physical exam, annual 06/15/2022   Nausea vomiting and diarrhea 06/15/2022   Hypertension 09/15/2021   Bipolar 1 disorder, depressed (HCC) 09/15/2021   Erectile dysfunction 09/15/2021   Inguinal hernia, right 09/15/2021   Current every day vaping 09/15/2021   Postoperative intestinal obstruction 01/30/2019   Morbid obesity (HCC) 01/23/2019   Bipolar disorder, in partial remission, most recent episode depressed (HCC) 09/28/2018   Back pain 06/26/2018   Depression 06/26/2018   GERD (gastroesophageal reflux disease) 06/26/2018   IBS (irritable bowel syndrome) 06/26/2018   Sleep apnea 06/26/2018

## 2023-07-05 NOTE — ED Notes (Signed)
Blood pressure medication Norvasc order has been modified due to patients elevated blood pressure

## 2023-07-05 NOTE — ED Notes (Signed)
Pt is currently sleeping, no distress noted, environmental check complete, will continue to monitor patient for safety.

## 2023-07-05 NOTE — ED Notes (Signed)
Pt approached nurses station stating, "I'm starting to feel nauseous and anxious". BP 156/104. Pt given sx relief medication. Pt appreciative. MD notified of interventions taken and agree. Will recheck BP in an hour and continue to monitor for safety.

## 2023-07-05 NOTE — ED Notes (Addendum)
Patient A&Ox4. Denies intent to harm self/others when asked. Denies A/VH. Patient c/o cold chills, sweating, HA and tremors. CIWA 8 on assessment. Ativan given for withdrawals. Pt appreciative. Support and encouragement provided. Routine safety checks conducted according to facility protocol. Encouraged patient to notify staff if thoughts of harm toward self or others arise. Patient verbalize understanding and agreement. Will continue to monitor for safety.

## 2023-07-05 NOTE — ED Notes (Signed)
Pt approached nurses station stating, "I'm sweating and having nightmares for some reason". Pt wearing a nicotine patch. Informed pt that side effect of the patch is that it can cause night terrors. Pt stated, "that explain it because I never have nightmares. Never". Informed pt to try taking patch off tonight to see if there is a difference. Pt provided clean shirt. Mild wetness noted on shirt. Pt given cool cloth. Pt appreciative. CIWA score 4. MD aware. No new orders given. Will continue to monitor for safety.

## 2023-07-05 NOTE — Progress Notes (Signed)
Pt sweating.

## 2023-07-06 DIAGNOSIS — Z79899 Other long term (current) drug therapy: Secondary | ICD-10-CM | POA: Diagnosis not present

## 2023-07-06 DIAGNOSIS — F10130 Alcohol abuse with withdrawal, uncomplicated: Secondary | ICD-10-CM | POA: Diagnosis not present

## 2023-07-06 DIAGNOSIS — Z91148 Patient's other noncompliance with medication regimen for other reason: Secondary | ICD-10-CM | POA: Diagnosis not present

## 2023-07-06 DIAGNOSIS — R4589 Other symptoms and signs involving emotional state: Secondary | ICD-10-CM | POA: Diagnosis not present

## 2023-07-06 LAB — POCT URINE DRUG SCREEN - MANUAL ENTRY (I-SCREEN)
POC Amphetamine UR: NOT DETECTED
POC Buprenorphine (BUP): NOT DETECTED
POC Cocaine UR: NOT DETECTED
POC Marijuana UR: POSITIVE — AB
POC Methadone UR: NOT DETECTED
POC Methamphetamine UR: NOT DETECTED
POC Morphine: NOT DETECTED
POC Oxazepam (BZO): POSITIVE — AB
POC Oxycodone UR: NOT DETECTED
POC Secobarbital (BAR): NOT DETECTED

## 2023-07-06 LAB — VALPROIC ACID LEVEL: Valproic Acid Lvl: <10 ug/mL — ABNORMAL LOW (ref 50.0–100.0)

## 2023-07-06 LAB — CARBAMAZEPINE LEVEL, TOTAL: Carbamazepine Lvl: <2 ug/mL — ABNORMAL LOW (ref 4.0–12.0)

## 2023-07-06 MED ORDER — HYDROCHLOROTHIAZIDE 25 MG PO TABS
25.0000 mg | ORAL_TABLET | Freq: Every day | ORAL | Status: DC
  Administered 2023-07-06 – 2023-07-07 (×2): 25 mg via ORAL
  Filled 2023-07-06 (×2): qty 1

## 2023-07-06 MED ORDER — LISINOPRIL 5 MG PO TABS
15.0000 mg | ORAL_TABLET | Freq: Every day | ORAL | Status: DC
  Administered 2023-07-06 – 2023-07-07 (×2): 15 mg via ORAL
  Filled 2023-07-06 (×2): qty 1

## 2023-07-06 NOTE — ED Notes (Signed)
Pt sitting in cafeteria eating dinner, watching television and interacting with peers. No acute distress noted. No concerns voiced. Informed pt to notify staff with any needs or assistance. Pt verbalized understanding and agreement. Will continue to monitor for safety.

## 2023-07-06 NOTE — ED Notes (Signed)
Pt is in the dayroom watching TV with peers. Pt denies SI/HI/AVH. Pt has no further complain.No acute distress noted. Will continue to monitor for safety and provide support.

## 2023-07-06 NOTE — ED Notes (Signed)
Patient is sleeping. Respirations equal and unlabored, skin warm and dry. No change in assessment or acuity. Routine safety checks conducted according to facility protocol. Will continue to monitor for safety.

## 2023-07-06 NOTE — Group Note (Signed)
Group Topic: Relapse and Recovery  Group Date: 07/06/2023 Start Time: 2005 End Time: 2100 Facilitators: Darin Engels  Department: [facility]  Number of Participants: 6  Group Focus: abuse issues, acceptance, coping skills, personal responsibility, and substance abuse education Treatment Modality:  Leisure Development Interventions utilized were leisure development, patient education, story telling, and support Purpose: enhance coping skills, express feelings, regain self-worth, and relapse prevention strategies  Name: Aaron Lozano Date of Birth: [birth year]  MR: [id number]    Level of Participation: active Quality of Participation: attentive, cooperative, and supportive Interactions with others: gave feedback Mood/Affect: appropriate and positive Triggers (if applicable): n/a Cognition: coherent/clear Progress: Gaining insight Response: pt actively listened to others and offered support Plan: patient will be encouraged to continue attending groups   Patients Problems:  Patient Active Problem List   Diagnosis Date Noted   Alcohol abuse 07/03/2023   Testosterone deficiency in male 03/21/2023   Nicotine dependence due to vaping tobacco product 03/21/2023   Bipolar II disorder (HCC) 11/18/2022   Generalized anxiety disorder 11/18/2022   Anxiety 08/17/2022   Physical exam, annual 06/15/2022   Nausea vomiting and diarrhea 06/15/2022   Hypertension 09/15/2021   Bipolar 1 disorder, depressed (HCC) 09/15/2021   Erectile dysfunction 09/15/2021   Inguinal hernia, right 09/15/2021   Current every day vaping 09/15/2021   Postoperative intestinal obstruction 01/30/2019   Morbid obesity (HCC) 01/23/2019   Bipolar disorder, in partial remission, most recent episode depressed (HCC) 09/28/2018   Back pain 06/26/2018   Depression 06/26/2018   GERD (gastroesophageal reflux disease) 06/26/2018   IBS (irritable bowel syndrome) 06/26/2018   Sleep apnea 06/26/2018

## 2023-07-06 NOTE — Group Note (Signed)
Group Topic: Communication  Group Date: 07/06/2023 Start Time: 0935 End Time: 1015 Facilitators: Salina April, RN  Department: [facility]  Number of Participants: 4  Group Focus: nursing group Treatment Modality:  Psychoeducation Interventions utilized were patient education Purpose: increase insight and trigger / craving management  Name: Aaron Lozano Date of Birth: Sep 28, 1970  MR: [id number]    Level of Participation: moderate Quality of Participation: cooperative Interactions with others: gave feedback Mood/Affect: appropriate Triggers (if applicable): none reported Cognition: coherent/clear and processing slowly Progress: Moderate Response: verbalized understanding Plan: follow-up needed  Patients Problems:  Patient Active Problem List   Diagnosis Date Noted   Alcohol abuse 07/03/2023   Testosterone deficiency in male 03/21/2023   Nicotine dependence due to vaping tobacco product 03/21/2023   Bipolar II disorder (HCC) 11/18/2022   Generalized anxiety disorder 11/18/2022   Anxiety 08/17/2022   Physical exam, annual 06/15/2022   Nausea vomiting and diarrhea 06/15/2022   Hypertension 09/15/2021   Bipolar 1 disorder, depressed (HCC) 09/15/2021   Erectile dysfunction 09/15/2021   Inguinal hernia, right 09/15/2021   Current every day vaping 09/15/2021   Postoperative intestinal obstruction 01/30/2019   Morbid obesity (HCC) 01/23/2019   Bipolar disorder, in partial remission, most recent episode depressed (HCC) 09/28/2018   Back pain 06/26/2018   Depression 06/26/2018   GERD (gastroesophageal reflux disease) 06/26/2018   IBS (irritable bowel syndrome) 06/26/2018   Sleep apnea 06/26/2018

## 2023-07-06 NOTE — ED Notes (Addendum)
Patient calm and receptive on approach. Denies SI/HI/AVH. Patient is on Ativan for ETOH use and abuse and reports tolerating it well. Patient BP 140/90, pt is hypertensive and is on medication here and at home. MD is aware and scheduled medication was given and tolerated. Patient denies at this time of feeling depressed.   Encouraged to come to staff for support if feeling arise.s reported without issue. Patient requested Tylenol for headache 4/10. Tylenol 650mg  PO given and tolerated. Will continue to monitor and report changes as noted.

## 2023-07-06 NOTE — ED Notes (Signed)
Pt sitting in dayroom watching television and interacting with peers. No acute distress noted. No concerns voiced. Informed pt to notify staff with any needs or assistance. Pt verbalized understanding and agreement. Will continue to monitor for safety.

## 2023-07-06 NOTE — Group Note (Signed)
Group Topic: Decisional Balance/Substance Abuse  Group Date: 07/06/2023 Start Time: 1415 End Time: 1500 Facilitators: Concha Norway, NT  Department: [facility]  Number of Participants: 8  Group Focus: concentration Treatment Modality:  Behavior Modification Therapy Interventions utilized were exploration Purpose: enhance coping skills  Name: Aaron Lozano Date of Birth: 09-Aug-1970  MR: [id number]    Level of Participation: active Quality of Participation: attentive Interactions with others: gave feedback Mood/Affect: appropriate Triggers (if applicable):   Cognition: concrete Progress: Significant Response:  Plan: follow-up needed  Patients Problems:  Patient Active Problem List   Diagnosis Date Noted   Alcohol abuse 07/03/2023   Testosterone deficiency in male 03/21/2023   Nicotine dependence due to vaping tobacco product 03/21/2023   Bipolar II disorder (HCC) 11/18/2022   Generalized anxiety disorder 11/18/2022   Anxiety 08/17/2022   Physical exam, annual 06/15/2022   Nausea vomiting and diarrhea 06/15/2022   Hypertension 09/15/2021   Bipolar 1 disorder, depressed (HCC) 09/15/2021   Erectile dysfunction 09/15/2021   Inguinal hernia, right 09/15/2021   Current every day vaping 09/15/2021   Postoperative intestinal obstruction 01/30/2019   Morbid obesity (HCC) 01/23/2019   Bipolar disorder, in partial remission, most recent episode depressed (HCC) 09/28/2018   Back pain 06/26/2018   Depression 06/26/2018   GERD (gastroesophageal reflux disease) 06/26/2018   IBS (irritable bowel syndrome) 06/26/2018   Sleep apnea 06/26/2018

## 2023-07-06 NOTE — ED Provider Notes (Signed)
Behavioral Health Progress Note  Date and Time: 07/06/2023 10:30 AM Name: Aaron Lozano MRN:  [id number] Reason for admission:   The patient is a 53 year old male with a past history of alcohol abuse and bipolar disorder who presented himself voluntarily reporting increasing depression and increased alcohol use.  Apparently recently lost his job and has been drinking heavily.  He states that he has been struggling with alcohol consumption for over 3 years but recently escalated the use after he lost his job for the last 3 months.  He attributes increased stress at work and relationship difficulties.  He reports that he has been drinking at least 3 cans of 24 ounce, 9.5% beer every day but over the last 4 or 5 days she is escalated and has been drinking anything and everything he can get his hands on.  He apparently fell at home on Saturday before admission because he was intoxicated and does not know what happened. Patient also reports that he has a long history of bipolar disorder and has not been taking his prescribed medication for several months and has stopped taking his Trileptal in over a month and not taken Depakote or Wellbutrin for several months.  He would like to restart his medications if possible.  Today he denied any active SI/HI/AVH and denied any passive thoughts.  Subjective:  The patient was seen and the chart was reviewed and the case was discussed with nursing staff.  He reports that he is much improved.  His withdrawals have improved and his CIWA scores have gradually improved.  He felt that the nicotine patch was triggering the symptoms of dizziness, excessive sweating and nightmares.  He discontinued the patch and seems to be much improved.  He denies any acute SI/HI/AVH.  He does have an outpatient psychiatrist and a therapist and has talked to his girlfriend and plans to go for outpatient rehab.  Overall he is stable and should be discharged by Thursday, 05/05/2024. He denies any  active SI/HI/AVH.  Diagnosis:  Final diagnoses:  None    Total Time spent with patient: 30 minutes  Past Psychiatric History: See H&P Past Medical History: Please see H&P Family History: Please see Fa please see H&P mily Psychiatric  History: Please see H&P Social History: Please see H&P  Additional Social History:     Patient reports that he has good social support.  His girlfriend is supportive and he has contacted his therapist.  He will be looking for a job and he has a place to stay.                    Sleep: Good  Appetite:  Good  Current Medications:  Current Facility-Administered Medications  Medication Dose Route Frequency Provider Last Rate Last Admin   acetaminophen (TYLENOL) tablet 650 mg  650 mg Oral Q6H PRN Ajibola, Ene A, NP   650 mg at 07/06/23 0929   alum & mag hydroxide-simeth (MAALOX/MYLANTA) 200-200-20 MG/5ML suspension 30 mL  30 mL Oral Q4H PRN Ajibola, Ene A, NP       amLODipine (NORVASC) tablet 5 mg  5 mg Oral Daily Onuoha, Chinwendu V, NP   5 mg at 07/06/23 0929   buPROPion (WELLBUTRIN XL) 24 hr tablet 150 mg  150 mg Oral Daily Rex Kras, MD   150 mg at 07/06/23 7829   cloNIDine (CATAPRES) tablet 0.1 mg  0.1 mg Oral QID PRN Rex Kras, MD   0.1 mg at 07/05/23 1552   feeding supplement (  ENSURE ENLIVE / ENSURE PLUS) liquid 237 mL  237 mL Oral BID BM Onuoha, Chinwendu V, NP   237 mL at 07/06/23 0936   hydrOXYzine (ATARAX) tablet 25 mg  25 mg Oral Q6H PRN Ajibola, Ene A, NP   25 mg at 07/05/23 2111   loperamide (IMODIUM) capsule 2-4 mg  2-4 mg Oral PRN Ajibola, Ene A, NP       LORazepam (ATIVAN) tablet 1 mg  1 mg Oral Q6H PRN Rex Kras, MD   1 mg at 07/05/23 1007   magnesium hydroxide (MILK OF MAGNESIA) suspension 30 mL  30 mL Oral Daily PRN Ajibola, Ene A, NP       multivitamin with minerals tablet 1 tablet  1 tablet Oral Daily Ajibola, Ene A, NP   1 tablet at 07/06/23 0936   neomycin-bacitracin-polymyxin [phone] OINT 1  Application  1 Application Topical Daily PRN Maryagnes Amos, FNP   1 Application at 07/04/23 1544   nicotine (NICODERM CQ - dosed in mg/24 hours) patch 21 mg  21 mg Transdermal Daily Maryagnes Amos, FNP   21 mg at 07/06/23 0936   ondansetron (ZOFRAN-ODT) disintegrating tablet 4 mg  4 mg Oral Q6H PRN Ajibola, Ene A, NP   4 mg at 07/05/23 1552   OXcarbazepine (TRILEPTAL) tablet 150 mg  150 mg Oral BID Rex Kras, MD   150 mg at 07/06/23 4098   thiamine (VITAMIN B1) tablet 100 mg  100 mg Oral Daily Ajibola, Ene A, NP   100 mg at 07/06/23 0929   traZODone (DESYREL) tablet 50 mg  50 mg Oral QHS PRN Ajibola, Ene A, NP   50 mg at 07/05/23 2111   Current Outpatient Medications  Medication Sig Dispense Refill   albuterol (VENTOLIN HFA) 108 (90 Base) MCG/ACT inhaler Inhale 2 puffs into the lungs every 6 (six) hours as needed for wheezing or shortness of breath. 8 g 1   sildenafil (REVATIO) 20 MG tablet TAKE 5 TABLETS BY MOUTH EVERY OTHER DAY AS NEEDED 1 HOUR PRIOR TO SEXUAL ACTIVITY. 75 tablet 0   buPROPion (WELLBUTRIN XL) 300 MG 24 hr tablet Take 1 tablet (300 mg total) by mouth daily. (Patient not taking: Reported on 07/04/2023) 90 tablet 0   cetirizine (ZYRTEC) 10 MG tablet Take 10 mg by mouth daily as needed for allergies. (Patient not taking: Reported on 07/04/2023)     dicyclomine (BENTYL) 20 MG tablet TAKE 1 TABLET BY MOUTH TWICE A DAY (Patient not taking: Reported on 07/04/2023) 180 tablet 1   hydrochlorothiazide (HYDRODIURIL) 25 MG tablet Take 1 tablet (25 mg total) by mouth daily. (Patient not taking: Reported on 07/04/2023) 90 tablet 3   lisinopril (ZESTRIL) 10 MG tablet TAKE 1 TABLET BY MOUTH EVERY DAY (Patient not taking: Reported on 07/04/2023) 90 tablet 1   Oxcarbazepine (TRILEPTAL) 300 MG tablet Take 300 mg by mouth daily. (Patient not taking: Reported on 07/04/2023)      Labs  Lab Results:  Admission on 07/03/2023, Discharged on 07/04/2023  Component Date Value Ref Range  Status   WBC 07/03/2023 4.7  4.0 - 10.5 K/uL Final   RBC 07/03/2023 5.51  4.22 - 5.81 MIL/uL Final   Hemoglobin 07/03/2023 16.4  13.0 - 17.0 g/dL Final   HCT 11/91/4782 47.4  39.0 - 52.0 % Final   MCV 07/03/2023 86.0  80.0 - 100.0 fL Final   MCH 07/03/2023 29.8  26.0 - 34.0 pg Final   MCHC 07/03/2023 34.6  30.0 - 36.0 g/dL  Final   RDW 07/03/2023 12.2  11.5 - 15.5 % Final   Platelets 07/03/2023 257  150 - 400 K/uL Final   nRBC 07/03/2023 0.0  0.0 - 0.2 % Final   Neutrophils Relative % 07/03/2023 59  % Final   Neutro Abs 07/03/2023 2.8  1.7 - 7.7 K/uL Final   Lymphocytes Relative 07/03/2023 27  % Final   Lymphs Abs 07/03/2023 1.3  0.7 - 4.0 K/uL Final   Monocytes Relative 07/03/2023 9  % Final   Monocytes Absolute 07/03/2023 0.4  0.1 - 1.0 K/uL Final   Eosinophils Relative 07/03/2023 3  % Final   Eosinophils Absolute 07/03/2023 0.2  0.0 - 0.5 K/uL Final   Basophils Relative 07/03/2023 2  % Final   Basophils Absolute 07/03/2023 0.1  0.0 - 0.1 K/uL Final   Immature Granulocytes 07/03/2023 0  % Final   Abs Immature Granulocytes 07/03/2023 0.01  0.00 - 0.07 K/uL Final   Performed at [facility], [address], [place], [place] [address]   Sodium 07/03/2023 139  135 - 145 mmol/L Final   Potassium 07/03/2023 3.8  3.5 - 5.1 mmol/L Final   Chloride 07/03/2023 105  98 - 111 mmol/L Final   CO2 07/03/2023 21 (L)  22 - 32 mmol/L Final   Glucose, Bld 07/03/2023 75  70 - 99 mg/dL Final   Glucose reference range applies only to samples taken after fasting for at least 8 hours.   BUN 07/03/2023 12  6 - 20 mg/dL Final   Creatinine, Ser 07/03/2023 0.93  0.61 - 1.24 mg/dL Final   Calcium 98/03/9146 9.0  8.9 - 10.3 mg/dL Final   Total Protein 82/95/6213 6.1 (L)  6.5 - 8.1 g/dL Final   Albumin 08/65/7846 3.9  3.5 - 5.0 g/dL Final   AST 96/29/5284 34  15 - 41 U/L Final   ALT 07/03/2023 37  0 - 44 U/L Final   Alkaline Phosphatase 07/03/2023 48  38 - 126 U/L Final   Total Bilirubin 07/03/2023 0.9   0.0 - 1.2 mg/dL Final   GFR, Estimated 07/03/2023 >60  >60 mL/min Final   Comment: (NOTE) Calculated using the CKD-EPI Creatinine Equation (2021)    Anion gap 07/03/2023 13  5 - 15 Final   Performed at [facility], [address], [place], [place] [address]   Hgb A1c MFr Bld 07/03/2023 4.4 (L)  4.8 - 5.6 % Final   Comment: (NOTE) Pre diabetes:          5.7%-6.4%  Diabetes:              >6.4%  Glycemic control for   <7.0% adults with diabetes    Mean Plasma Glucose 07/03/2023 79.58  mg/dL Final   Performed at [facility], [address] [place], [place] [address]   Alcohol, Ethyl (B) 07/03/2023 49 (H)  <10 mg/dL Final   Comment: (NOTE) Lowest detectable limit for serum alcohol is 10 mg/dL.  For medical purposes only. Performed at [facility], [address] [place], [place] [address]    Cholesterol 07/03/2023 191  0 - 200 mg/dL Final   Triglycerides 44/07/4740 231 (H)  <150 mg/dL Final   HDL 59/56/3875 75  >40 mg/dL Final   Total CHOL/HDL Ratio 07/03/2023 2.5  RATIO Final   VLDL 07/03/2023 46 (H)  0 - 40 mg/dL Final   LDL Cholesterol 07/03/2023 70  0 - 99 mg/dL Final   Comment:  Total Cholesterol/HDL:CHD Risk Coronary Heart Disease Risk Table                     Men   Women  1/2 Average Risk   3.4   3.3  Average Risk       5.0   4.4  2 X Average Risk   9.6   7.1  3 X Average Risk  23.4   11.0        Use the calculated Patient Ratio above and the CHD Risk Table to determine the patient's CHD Risk.        ATP III CLASSIFICATION (LDL):  <100     mg/dL   Optimal  657-846  mg/dL   Near or Above                    Optimal  130-159  mg/dL   Borderline  962-952  mg/dL   High  >841     mg/dL   Very High Performed at [facility], [address], [place], [place] [address]    TSH 07/03/2023 4.431  0.350 - 4.500 uIU/mL Final   Comment: Performed by a 3rd Generation assay with a functional sensitivity of <=0.01 uIU/mL. Performed at [facility], [address] [place], [place] [address]    Prolactin 07/03/2023 8.9  3.6 - 25.2 ng/mL Final   Comment: (NOTE) Performed At: [facility] [address] [place], [place] [id number] Jolene Schimke MD VQ:[id number]   Office Visit on 03/21/2023  Component Date Value Ref Range Status   Testosterone, Total, LC-MS-MS 03/21/2023 219 (L)  250 - 1,100 ng/dL Final   Comment: Men with clinically significant hypogonadal symptoms and testosterone values repeatedly in the range of the 200-300 ng/dL or less, may benefit from testosterone treatment after adequate risk and benefits counseling. . For additional information, please refer to https://education.quest[employer].com/faq/FAQ165 (This link is being provided for informational/educational purposes only.) (Note) . This test was developed and its analytical performance  characteristics have been determined by [facility]. It has  not been cleared or approved by the FDA. This assay has  been validated pursuant to the CLIA regulations and is  used for clinical purposes. . .    Free Testosterone 03/21/2023 31.1 (L)  35.0 - 155.0 pg/mL Final   Comment: (Note) This test was developed and its analytical performance  characteristics have been determined by [facility]. It has  not been cleared or approved by the FDA. This assay has  been validated pursuant to the CLIA regulations and is  used for clinical purposes. . [facility] [address] [place] [address] [phone] Junita Push L. Thompson Caul, MD, PhD    Vitamin B-12 03/21/2023 292  200 - 1,100 pg/mL Final   Comment: . Please Note: Although the reference range for vitamin B12 is [phone] pg/mL, it has been reported that between 5 and 10% of patients with values between 200 and 400 pg/mL may experience neuropsychiatric and hematologic abnormalities due to occult B12 deficiency; less than 1% of patients with values above 400 pg/mL will  have symptoms. .    Glucose, Bld 03/21/2023 91  65 - 99 mg/dL Final   Comment: .            Fasting reference interval .    BUN 03/21/2023 18  7 - 25 mg/dL Final   Creat 30/16/0109 0.87  0.70 - 1.30 mg/dL Final   eGFR 32/35/5732  103  > OR = 60 mL/min/1.12m2 Final   BUN/Creatinine Ratio 03/21/2023 SEE NOTE:  6 - 22 (calc) Final   Comment:    Not Reported: BUN and Creatinine are within    reference range. .    Sodium 03/21/2023 141  135 - 146 mmol/L Final   Potassium 03/21/2023 4.3  3.5 - 5.3 mmol/L Final   Chloride 03/21/2023 107  98 - 110 mmol/L Final   CO2 03/21/2023 25  20 - 32 mmol/L Final   Calcium 03/21/2023 9.1  8.6 - 10.3 mg/dL Final   Total Protein 16/02/9603 6.2  6.1 - 8.1 g/dL Final   Albumin 54/01/8118 4.2  3.6 - 5.1 g/dL Final   Globulin 14/78/2956 2.0  1.9 - 3.7 g/dL (calc) Final   AG Ratio 03/21/2023 2.1  1.0 - 2.5 (calc) Final   Total Bilirubin 03/21/2023 0.6  0.2 - 1.2 mg/dL Final   Alkaline phosphatase (APISO) 03/21/2023 50  35 - 144 U/L Final   AST 03/21/2023 31  10 - 35 U/L Final   ALT 03/21/2023 42  9 - 46 U/L Final   WBC 03/21/2023 4.2  3.8 - 10.8 Thousand/uL Final   RBC 03/21/2023 5.12  4.20 - 5.80 Million/uL Final   Hemoglobin 03/21/2023 15.4  13.2 - 17.1 g/dL Final   HCT 21/30/8657 46.2  38.5 - 50.0 % Final   MCV 03/21/2023 90.2  80.0 - 100.0 fL Final   MCH 03/21/2023 30.1  27.0 - 33.0 pg Final   MCHC 03/21/2023 33.3  32.0 - 36.0 g/dL Final   Comment: For adults, a slight decrease in the calculated MCHC value (in the range of 30 to 32 g/dL) is most likely not clinically significant; however, it should be interpreted with caution in correlation with other red cell parameters and the patient's clinical condition.    RDW 03/21/2023 11.9  11.0 - 15.0 % Final   Platelets 03/21/2023 214  140 - 400 Thousand/uL Final   MPV 03/21/2023 10.4  7.5 - 12.5 fL Final   Neutro Abs 03/21/2023 2,516  1,500 - 7,800 cells/uL Final   Absolute Lymphocytes 03/21/2023  1,109  850 - 3,900 cells/uL Final   Absolute Monocytes 03/21/2023 433  200 - 950 cells/uL Final   Eosinophils Absolute 03/21/2023 92  15 - 500 cells/uL Final   Basophils Absolute 03/21/2023 50  0 - 200 cells/uL Final   Neutrophils Relative % 03/21/2023 59.9  % Final   Total Lymphocyte 03/21/2023 26.4  % Final   Monocytes Relative 03/21/2023 10.3  % Final   Eosinophils Relative 03/21/2023 2.2  % Final   Basophils Relative 03/21/2023 1.2  % Final    Blood Alcohol level:  Lab Results  Component Value Date   ETH 49 (H) 07/03/2023    Metabolic Disorder Labs: Lab Results  Component Value Date   HGBA1C 4.4 (L) 07/03/2023   MPG 79.58 07/03/2023   Lab Results  Component Value Date   PROLACTIN 8.9 07/03/2023   Lab Results  Component Value Date   CHOL 191 07/03/2023   TRIG 231 (H) 07/03/2023   HDL 75 07/03/2023   CHOLHDL 2.5 07/03/2023   VLDL 46 (H) 07/03/2023   LDLCALC 70 07/03/2023   LDLCALC 75 06/15/2022    Therapeutic Lab Levels: No results found for: "LITHIUM" No results found for: "VALPROATE" No results found for: "CBMZ"  Physical Findings   AUDIT    Flowsheet Row Office Visit from 03/21/2023 in [facility]  Alcohol Use  Disorder Identification Test Final Score (AUDIT) 3       GAD-7    Flowsheet Row Office Visit from 03/21/2023 in [facility] Office Visit from 06/15/2022 in [facility]  Total GAD-7 Score 1 2      PHQ2-9    Flowsheet Row ED from 07/04/2023 in [facility] Office Visit from 03/21/2023 in [facility] Office Visit from 06/15/2022 in [facility] Office Visit from 09/15/2021 in [facility]  PHQ-2 Total Score 2 1 1  0  PHQ-9 Total Score 6 4 3  --      Flowsheet Row ED from 07/04/2023 in [facility] ED from 07/03/2023 in [facility] ED from 06/14/2023 in [facility] at [facility])  C-SSRS RISK CATEGORY No Risk Low Risk No Risk        Musculoskeletal  Strength & Muscle Tone: within normal limits Gait & Station: normal Patient leans: N/A  Psychiatric Specialty Exam  Presentation  General Appearance:  Appropriate for Environment  Eye Contact: Fair  Speech: Clear and Coherent  Speech Volume: Normal  Handedness: Right   Mood and Affect  Mood: Anxious; Depressed  Affect: Appropriate   Thought Process  Thought Processes: Coherent  Descriptions of Associations:Intact  Orientation:Full (Time, Place and Person)  Thought Content:Logical  Diagnosis of Schizophrenia or Schizoaffective disorder in past: No    Hallucinations:No data recorded  Ideas of Reference:None  Suicidal Thoughts:No data recorded  Homicidal Thoughts:No data recorded   Sensorium  Memory: Immediate Good; Recent Good; Remote Good  Judgment: Fair  Insight: Fair   Art therapist  Concentration: Fair  Attention Span: Fair  Recall: Fair  Fund of Knowledge: Fair  Language: Fair   Psychomotor Activity  Psychomotor Activity: No data recorded   Assets  Assets: Communication Skills   Sleep  Sleep: No data recorded   No data recorded  Physical Exam  Physical Exam ROS Blood pressure (!) 140/90, pulse 68, temperature 98.8 F (37.1 C), temperature source Oral, resp. rate 18, SpO2 97%. There is no height or weight on file to calculate BMI.  Treatment Plan Summary: Daily contact with patient to assess and evaluate symptoms and progress in treatment and Medication management Continue with CIWA protocol. Continue with amlodipine 5 mg a day Patient is much improved and the plan is for him to be discharged on 05/05/2024 to outpatient care.   Rex Kras, MD 07/06/2023 10:30 AM

## 2023-07-07 DIAGNOSIS — F10130 Alcohol abuse with withdrawal, uncomplicated: Secondary | ICD-10-CM | POA: Diagnosis not present

## 2023-07-07 DIAGNOSIS — R4589 Other symptoms and signs involving emotional state: Secondary | ICD-10-CM | POA: Diagnosis not present

## 2023-07-07 DIAGNOSIS — Z91148 Patient's other noncompliance with medication regimen for other reason: Secondary | ICD-10-CM | POA: Diagnosis not present

## 2023-07-07 DIAGNOSIS — Z79899 Other long term (current) drug therapy: Secondary | ICD-10-CM | POA: Diagnosis not present

## 2023-07-07 MED ORDER — BUPROPION HCL ER (XL) 150 MG PO TB24: 150.0000 mg | ORAL_TABLET | Freq: Every day | ORAL | 0 refills | Status: DC

## 2023-07-07 MED ORDER — LISINOPRIL 10 MG PO TABS: 15.0000 mg | ORAL_TABLET | Freq: Every day | ORAL | 0 refills | Status: AC

## 2023-07-07 MED ORDER — BUPROPION HCL ER (XL) 150 MG PO TB24: 150.0000 mg | ORAL_TABLET | Freq: Every day | ORAL | 0 refills | Status: AC

## 2023-07-07 MED ORDER — HYDROCHLOROTHIAZIDE 25 MG PO TABS: 25.0000 mg | ORAL_TABLET | Freq: Every day | ORAL | 0 refills | Status: AC

## 2023-07-07 MED ORDER — OXCARBAZEPINE 150 MG PO TABS: 150.0000 mg | ORAL_TABLET | Freq: Two times a day (BID) | ORAL | 0 refills | Status: AC

## 2023-07-07 MED ORDER — LISINOPRIL 5 MG PO TABS: 15.0000 mg | ORAL_TABLET | Freq: Every day | ORAL | 0 refills | Status: DC

## 2023-07-07 NOTE — ED Notes (Signed)
Patient is sleeping. Respirations equal and unlabored, skin warm and dry. No change in assessment or acuity. Routine safety checks conducted according to facility protocol. Will continue to monitor for safety.

## 2023-07-07 NOTE — Discharge Planning (Signed)
LCSW spoke with patient on this morning regarding disposition plans. Patient reports he plans to return home on today and will drive himself back home as his vehicle is outside the facility. Patient reports he has an appt scheduled to couples therapy in a week. Patient reports he also will be seen by his therapist at [facility] on 07/12/2023. Patient reports he has also selected two AA meetings that he plans to attend on tonight and tomorrow. Patient reports appreciation for teams assistance during his detox. Brief supportive counseling was provided to the patient. No other needs were reported at this time. LCSW to sign off. Please inform if further LCSW needs arise prior to discharge.   Fernande Boyden, LCSW Clinical Social Worker [facility] Ph: [phone]

## 2023-07-07 NOTE — ED Provider Notes (Signed)
 FBC/OBS ASAP Discharge Summary  Date and Time: 07/07/2023 8:55 AM  Name: Aaron Lozano  MRN:  [id number]   Discharge Diagnoses:  Final diagnoses:  None   Identifying information :Aaron Lozano is a 53 y.o. male with psychiatric history significant for alcohol abuse and bipolar disorder.  Presented voluntarily to [facility] reporting depressive symptoms and requesting alcohol detox.  Patient is accompanied by his girlfriend, Morrell Riddle.  Patient gave verbal consent for his girlfriend to remain present during assessment.   Subjective: The patient was admitted and evaluated.  He reports that his symptoms are much improved.  He denies any active depression and denies any active suicidal or homicidal ideations.  He is excited about discharge and has made appropriate plans to see his therapist and counselor.  He wants to go to outpatient rehab and would like to go and look for a job also.  His girlfriend is quite supportive.  He is contracting for safety and denies any active SI/SI/AVH.  Stay Summary: On admission patient was having acute withdrawals of alcohol.  He was tremulous and endorsed in excessive sweating, shakiness and cravings.  He was placed on CIWA protocol.  His blood pressure fluctuated and he was initially placed on as needed clonidine and amlodipine and then Misty Stanley changed his home antihypertensives.  He has been off the psychotropics for some time and he was restarted on Wellbutrin which was titrated to 150 mg a day.  He is also started on Trileptal 150 mg twice a day.  Additional titration will be done as an outpatient.  Total Time spent with patient: 30 minutes  Past Psychiatric History: Please see H&P Past Medical History: Please see H&P Family History: Please see H&P Family Psychiatric History: Please see H&P Social History: As noted in H&P.  Patient has a home to stay and lives with his girlfriend. Tobacco Cessation:  Prescription not provided because: Patient had excessive nightmares and  sweating on the NicoDerm patch.  Current Medications:  Current Facility-Administered Medications  Medication Dose Route Frequency Provider Last Rate Last Admin   acetaminophen (TYLENOL) tablet 650 mg  650 mg Oral Q6H PRN Ajibola, Ene A, NP   650 mg at 07/07/23 0448   alum & mag hydroxide-simeth (MAALOX/MYLANTA) 200-200-20 MG/5ML suspension 30 mL  30 mL Oral Q4H PRN Ajibola, Ene A, NP       buPROPion (WELLBUTRIN XL) 24 hr tablet 150 mg  150 mg Oral Daily Rex Kras, MD   150 mg at 07/06/23 0929   cloNIDine (CATAPRES) tablet 0.1 mg  0.1 mg Oral QID PRN Rex Kras, MD   0.1 mg at 07/06/23 1828   feeding supplement (ENSURE ENLIVE / ENSURE PLUS) liquid 237 mL  237 mL Oral BID BM Onuoha, Chinwendu V, NP   237 mL at 07/06/23 1601   hydrochlorothiazide (HYDRODIURIL) tablet 25 mg  25 mg Oral Daily Rex Kras, MD   25 mg at 07/06/23 1608   lisinopril (ZESTRIL) tablet 15 mg  15 mg Oral Daily Rex Kras, MD   15 mg at 07/06/23 1608   LORazepam (ATIVAN) tablet 1 mg  1 mg Oral Q6H PRN Rex Kras, MD   1 mg at 07/05/23 1007   magnesium hydroxide (MILK OF MAGNESIA) suspension 30 mL  30 mL Oral Daily PRN Ajibola, Ene A, NP       multivitamin with minerals tablet 1 tablet  1 tablet Oral Daily Ajibola, Ene A, NP   1 tablet at 07/06/23 0936   neomycin-bacitracin-polymyxin [phone] OINT 1  Application  1 Application Topical Daily PRN Maryagnes Amos, FNP   1 Application at 07/04/23 1544   nicotine (NICODERM CQ - dosed in mg/24 hours) patch 21 mg  21 mg Transdermal Daily Maryagnes Amos, FNP   21 mg at 07/06/23 0936   OXcarbazepine (TRILEPTAL) tablet 150 mg  150 mg Oral BID Rex Kras, MD   150 mg at 07/06/23 2109   thiamine (VITAMIN B1) tablet 100 mg  100 mg Oral Daily Ajibola, Ene A, NP   100 mg at 07/06/23 4098   traZODone (DESYREL) tablet 50 mg  50 mg Oral QHS PRN Ajibola, Ene A, NP   50 mg at 07/06/23 2108   Current Outpatient Medications  Medication Sig  Dispense Refill   albuterol (VENTOLIN HFA) 108 (90 Base) MCG/ACT inhaler Inhale 2 puffs into the lungs every 6 (six) hours as needed for wheezing or shortness of breath. 8 g 1   sildenafil (REVATIO) 20 MG tablet TAKE 5 TABLETS BY MOUTH EVERY OTHER DAY AS NEEDED 1 HOUR PRIOR TO SEXUAL ACTIVITY. 75 tablet 0   buPROPion (WELLBUTRIN XL) 150 MG 24 hr tablet Take 1 tablet (150 mg total) by mouth daily. 30 tablet 0   cetirizine (ZYRTEC) 10 MG tablet Take 10 mg by mouth daily as needed for allergies. (Patient not taking: Reported on 07/04/2023)     hydrochlorothiazide (HYDRODIURIL) 25 MG tablet Take 1 tablet (25 mg total) by mouth daily. 30 tablet 0   lisinopril (ZESTRIL) 5 MG tablet Take 3 tablets (15 mg total) by mouth daily. 30 tablet 0   OXcarbazepine (TRILEPTAL) 150 MG tablet Take 1 tablet (150 mg total) by mouth 2 (two) times daily. 60 tablet 0    PTA Medications:  Facility Ordered Medications  Medication   acetaminophen (TYLENOL) tablet 650 mg   alum & mag hydroxide-simeth (MAALOX/MYLANTA) 200-200-20 MG/5ML suspension 30 mL   [COMPLETED] cloNIDine (CATAPRES) tablet 0.1 mg   [EXPIRED] hydrOXYzine (ATARAX) tablet 25 mg   [EXPIRED] loperamide (IMODIUM) capsule 2-4 mg   magnesium hydroxide (MILK OF MAGNESIA) suspension 30 mL   multivitamin with minerals tablet 1 tablet   [EXPIRED] ondansetron (ZOFRAN-ODT) disintegrating tablet 4 mg   thiamine (VITAMIN B1) tablet 100 mg   traZODone (DESYREL) tablet 50 mg   [COMPLETED] buPROPion (WELLBUTRIN) tablet 75 mg   buPROPion (WELLBUTRIN XL) 24 hr tablet 150 mg   OXcarbazepine (TRILEPTAL) tablet 150 mg   neomycin-bacitracin-polymyxin [phone] OINT 1 Application   nicotine (NICODERM CQ - dosed in mg/24 hours) patch 21 mg   [COMPLETED] cloNIDine (CATAPRES) tablet 0.1 mg   LORazepam (ATIVAN) tablet 1 mg   cloNIDine (CATAPRES) tablet 0.1 mg   feeding supplement (ENSURE ENLIVE / ENSURE PLUS) liquid 237 mL   hydrochlorothiazide (HYDRODIURIL) tablet 25 mg    lisinopril (ZESTRIL) tablet 15 mg   PTA Medications  Medication Sig   albuterol (VENTOLIN HFA) 108 (90 Base) MCG/ACT inhaler Inhale 2 puffs into the lungs every 6 (six) hours as needed for wheezing or shortness of breath.   sildenafil (REVATIO) 20 MG tablet TAKE 5 TABLETS BY MOUTH EVERY OTHER DAY AS NEEDED 1 HOUR PRIOR TO SEXUAL ACTIVITY.   cetirizine (ZYRTEC) 10 MG tablet Take 10 mg by mouth daily as needed for allergies. (Patient not taking: Reported on 07/04/2023)   hydrochlorothiazide (HYDRODIURIL) 25 MG tablet Take 1 tablet (25 mg total) by mouth daily.   lisinopril (ZESTRIL) 5 MG tablet Take 3 tablets (15 mg total) by mouth daily.   buPROPion (WELLBUTRIN XL)  150 MG 24 hr tablet Take 1 tablet (150 mg total) by mouth daily.   OXcarbazepine (TRILEPTAL) 150 MG tablet Take 1 tablet (150 mg total) by mouth 2 (two) times daily.       07/07/2023    8:51 AM 07/04/2023   10:21 AM 07/04/2023    4:14 AM  Depression screen PHQ 2/9  Decreased Interest 0 1 3  Down, Depressed, Hopeless 0 1 3  PHQ - 2 Score 0 2 6  Altered sleeping 1 1 3   Tired, decreased energy 0 1 2  Change in appetite 0 1 1  Feeling bad or failure about yourself  0 1 3  Trouble concentrating 0 0 2  Moving slowly or fidgety/restless 0 0 2  Suicidal thoughts 0 0 1  PHQ-9 Score 1 6 20   Difficult doing work/chores   Extremely dIfficult    Flowsheet Row ED from 07/04/2023 in [facility] ED from 07/03/2023 in [facility] ED from 06/14/2023 in [facility])  C-SSRS RISK CATEGORY No Risk Low Risk No Risk       Musculoskeletal  Strength & Muscle Tone: within normal limits Gait & Station: normal Patient leans: N/A  Psychiatric Specialty Exam  Presentation  General Appearance:  Appropriate for Environment  Eye Contact: Fair  Speech: Clear and Coherent  Speech Volume: Normal  Handedness: Right   Mood and Affect   Mood: Anxious; Depressed  Affect: Appropriate   Thought Process  Thought Processes: Coherent  Descriptions of Associations:Intact  Orientation:Full (Time, Place and Person)  Thought Content:Logical  Diagnosis of Schizophrenia or Schizoaffective disorder in past: No    Hallucinations:No data recorded Ideas of Reference:None  Suicidal Thoughts:No data recorded Homicidal Thoughts:No data recorded  Sensorium  Memory: Immediate Good; Recent Good; Remote Good  Judgment: Fair  Insight: Fair   Chartered certified accountant: Fair  Attention Span: Fair  Recall: [employer] of Knowledge: Fair  Language: Fair   Psychomotor Activity  Psychomotor Activity:No data recorded  Assets  Assets: Communication Skills   Sleep  Sleep:No data recorded  No data recorded  Physical Exam  Physical Exam ROS Blood pressure 122/82, pulse 66, temperature 98.2 F (36.8 C), temperature source Oral, resp. rate 19, SpO2 97%. There is no height or weight on file to calculate BMI.  Demographic Factors:  Male, Caucasian, and Unemployed  Loss Factors: Decrease in vocational status  Historical Factors: Impulsivity  Risk Reduction Factors:   Religious beliefs about death, Living with another person, especially a relative, Positive social support, and Positive therapeutic relationship  Continued Clinical Symptoms:  Depression:   Impulsivity Alcohol/Substance Abuse/Dependencies  Cognitive Features That Contribute To Risk:  Polarized thinking    Suicide Risk:  Mild:  Suicidal ideation of limited frequency, intensity, duration, and specificity.  There are no identifiable plans, no associated intent, mild dysphoria and related symptoms, good self-control (both objective and subjective assessment), few other risk factors, and identifiable protective factors, including available and accessible social support.  Plan Of Care/Follow-up recommendations:  Activity:  As  tolerated  Disposition: The patient is being discharged home with a safety plan and outpatient follow-up appointments.  Prognosis is fair to guarded depending on compliance.  Rex Kras, MD 07/07/2023, 8:55 AM

## 2023-07-26 ENCOUNTER — Other Ambulatory Visit: Payer: Self-pay | Admitting: Family Medicine

## 2023-07-26 DIAGNOSIS — N529 Male erectile dysfunction, unspecified: Secondary | ICD-10-CM

## 2023-08-27 ENCOUNTER — Other Ambulatory Visit: Payer: Self-pay | Admitting: Family Medicine

## 2023-08-27 DIAGNOSIS — N529 Male erectile dysfunction, unspecified: Secondary | ICD-10-CM

## 2023-10-02 ENCOUNTER — Other Ambulatory Visit: Payer: Self-pay | Admitting: Family Medicine

## 2023-10-02 DIAGNOSIS — N529 Male erectile dysfunction, unspecified: Secondary | ICD-10-CM

## 2023-11-04 ENCOUNTER — Other Ambulatory Visit: Payer: Self-pay | Admitting: Family Medicine

## 2023-11-04 DIAGNOSIS — N529 Male erectile dysfunction, unspecified: Secondary | ICD-10-CM

## 2023-12-13 ENCOUNTER — Other Ambulatory Visit: Payer: Self-pay | Admitting: Family Medicine

## 2023-12-13 DIAGNOSIS — N529 Male erectile dysfunction, unspecified: Secondary | ICD-10-CM

## 2024-02-01 ENCOUNTER — Other Ambulatory Visit: Payer: Self-pay | Admitting: Family Medicine

## 2024-02-01 DIAGNOSIS — N529 Male erectile dysfunction, unspecified: Secondary | ICD-10-CM

## 2024-02-06 ENCOUNTER — Other Ambulatory Visit: Payer: Self-pay

## 2024-02-06 DIAGNOSIS — N529 Male erectile dysfunction, unspecified: Secondary | ICD-10-CM

## 2024-02-06 MED ORDER — SILDENAFIL CITRATE 20 MG PO TABS: ORAL_TABLET | ORAL | 0 refills | Status: AC
